# Patient Record
Sex: Male | Born: 1955 | Race: White | Hispanic: No | Marital: Single | Smoking: Former smoker
Health system: Southern US, Community
[De-identification: ages and names within clinical notes are randomized; demographics above are authoritative.]

## PROBLEM LIST (undated history)

## (undated) DIAGNOSIS — E119 Type 2 diabetes mellitus without complications: Secondary | ICD-10-CM

## (undated) DIAGNOSIS — E7800 Pure hypercholesterolemia, unspecified: Secondary | ICD-10-CM

## (undated) DIAGNOSIS — I10 Essential (primary) hypertension: Secondary | ICD-10-CM

## (undated) DIAGNOSIS — K219 Gastro-esophageal reflux disease without esophagitis: Secondary | ICD-10-CM

## (undated) DIAGNOSIS — F419 Anxiety disorder, unspecified: Secondary | ICD-10-CM

## (undated) HISTORY — DX: Pure hypercholesterolemia, unspecified: E78.00

## (undated) HISTORY — DX: Type 2 diabetes mellitus without complications: E11.9

## (undated) HISTORY — PX: COLONOSCOPY, ESOPHAGOGASTRODUODENOSCOPY (EGD) AND ESOPHAGEAL DILATION: SHX5781

## (undated) HISTORY — PX: COLONOSCOPY: SHX174

---

## 2015-06-11 ENCOUNTER — Encounter (INDEPENDENT_AMBULATORY_CARE_PROVIDER_SITE_OTHER): Payer: Self-pay | Admitting: *Deleted

## 2015-06-30 ENCOUNTER — Encounter (INDEPENDENT_AMBULATORY_CARE_PROVIDER_SITE_OTHER): Payer: Self-pay | Admitting: *Deleted

## 2015-07-01 ENCOUNTER — Other Ambulatory Visit (INDEPENDENT_AMBULATORY_CARE_PROVIDER_SITE_OTHER): Payer: Self-pay | Admitting: *Deleted

## 2015-07-01 DIAGNOSIS — Z1211 Encounter for screening for malignant neoplasm of colon: Secondary | ICD-10-CM

## 2015-07-01 DIAGNOSIS — Z800 Family history of malignant neoplasm of digestive organs: Secondary | ICD-10-CM

## 2015-08-28 ENCOUNTER — Telehealth (INDEPENDENT_AMBULATORY_CARE_PROVIDER_SITE_OTHER): Payer: Self-pay | Admitting: *Deleted

## 2015-08-28 NOTE — Telephone Encounter (Signed)
Patient needs trilyte 

## 2015-09-01 MED ORDER — PEG 3350-KCL-NA BICARB-NACL 420 G PO SOLR: 4000.0000 mL | Freq: Once | ORAL | 0 refills | Status: AC

## 2015-09-14 ENCOUNTER — Telehealth (INDEPENDENT_AMBULATORY_CARE_PROVIDER_SITE_OTHER): Payer: Self-pay | Admitting: *Deleted

## 2015-09-14 NOTE — Telephone Encounter (Signed)
Referring MD/PCP:    Procedure: tcs  Reason/Indication:  Screening, fam hx colon ca  Has patient had this procedure before?  Yes, 8-9 yrs ago  If so, when, by whom and where?    Is there a family history of colon cancer?  Yes, mother & sister  Who?  What age when diagnosed?    Is patient diabetic?   no      Does patient have prosthetic heart valve or mechanical valve?  no  Do you have a pacemaker?  no  Has patient ever had endocarditis? no  Has patient had joint replacement within last 12 months?  no  Does patient tend to be constipated or take laxatives? no  Does patient have a history of alcohol/drug use?  no  Is patient on Coumadin, Plavix and/or Aspirin? no  Medications: citalopram herb daily, esomeprazole 40 mg daily, losartan 25 mg daily, vit d 50,000 2 tab weekly  Allergies: nkda  Medication Adjustment:   Procedure date & time: 10/08/15 at 830

## 2015-09-16 NOTE — Telephone Encounter (Signed)
agree

## 2015-10-08 ENCOUNTER — Encounter (HOSPITAL_COMMUNITY): Payer: Self-pay | Admitting: *Deleted

## 2015-10-08 ENCOUNTER — Ambulatory Visit (HOSPITAL_COMMUNITY)
Admission: RE | Admit: 2015-10-08 | Discharge: 2015-10-08 | Disposition: A | Discharge status: Home / Self Care | Payer: BLUE CROSS/BLUE SHIELD | Source: Ambulatory Visit | Attending: Internal Medicine | Admitting: Internal Medicine

## 2015-10-08 ENCOUNTER — Encounter (HOSPITAL_COMMUNITY)
Admission: RE | Disposition: A | Discharge status: Home / Self Care | Payer: Self-pay | Source: Ambulatory Visit | Attending: Internal Medicine

## 2015-10-08 DIAGNOSIS — I10 Essential (primary) hypertension: Secondary | ICD-10-CM | POA: Diagnosis not present

## 2015-10-08 DIAGNOSIS — D125 Benign neoplasm of sigmoid colon: Secondary | ICD-10-CM | POA: Diagnosis not present

## 2015-10-08 DIAGNOSIS — K5730 Diverticulosis of large intestine without perforation or abscess without bleeding: Secondary | ICD-10-CM | POA: Diagnosis not present

## 2015-10-08 DIAGNOSIS — Z1211 Encounter for screening for malignant neoplasm of colon: Secondary | ICD-10-CM | POA: Diagnosis present

## 2015-10-08 DIAGNOSIS — K219 Gastro-esophageal reflux disease without esophagitis: Secondary | ICD-10-CM | POA: Insufficient documentation

## 2015-10-08 DIAGNOSIS — K648 Other hemorrhoids: Secondary | ICD-10-CM | POA: Diagnosis not present

## 2015-10-08 DIAGNOSIS — Z87891 Personal history of nicotine dependence: Secondary | ICD-10-CM | POA: Diagnosis not present

## 2015-10-08 DIAGNOSIS — Z800 Family history of malignant neoplasm of digestive organs: Secondary | ICD-10-CM | POA: Insufficient documentation

## 2015-10-08 DIAGNOSIS — Z79899 Other long term (current) drug therapy: Secondary | ICD-10-CM | POA: Diagnosis not present

## 2015-10-08 DIAGNOSIS — K635 Polyp of colon: Secondary | ICD-10-CM | POA: Diagnosis not present

## 2015-10-08 DIAGNOSIS — K644 Residual hemorrhoidal skin tags: Secondary | ICD-10-CM | POA: Insufficient documentation

## 2015-10-08 DIAGNOSIS — D127 Benign neoplasm of rectosigmoid junction: Secondary | ICD-10-CM | POA: Diagnosis not present

## 2015-10-08 HISTORY — DX: Essential (primary) hypertension: I10

## 2015-10-08 HISTORY — PX: COLONOSCOPY: SHX5424

## 2015-10-08 HISTORY — DX: Anxiety disorder, unspecified: F41.9

## 2015-10-08 HISTORY — DX: Gastro-esophageal reflux disease without esophagitis: K21.9

## 2015-10-08 SURGERY — COLONOSCOPY
Anesthesia: Moderate Sedation

## 2015-10-08 MED ORDER — MEPERIDINE HCL 50 MG/ML IJ SOLN
INTRAMUSCULAR | Status: AC
  Filled 2015-10-08: qty 1

## 2015-10-08 MED ORDER — MIDAZOLAM HCL 5 MG/5ML IJ SOLN
INTRAMUSCULAR | Status: AC
  Filled 2015-10-08: qty 10

## 2015-10-08 MED ORDER — DICYCLOMINE HCL 10 MG PO CAPS: 10.0000 mg | ORAL_CAPSULE | Freq: Two times a day (BID) | ORAL | 5 refills | Status: DC

## 2015-10-08 MED ORDER — SODIUM CHLORIDE 0.9 % IV SOLN
INTRAVENOUS | Status: DC
  Administered 2015-10-08: 08:00:00 via INTRAVENOUS

## 2015-10-08 MED ORDER — MEPERIDINE HCL 50 MG/ML IJ SOLN
INTRAMUSCULAR | Status: DC | PRN
  Administered 2015-10-08 (×2): 25 mg via INTRAVENOUS

## 2015-10-08 MED ORDER — MIDAZOLAM HCL 5 MG/5ML IJ SOLN
INTRAMUSCULAR | Status: DC | PRN
  Administered 2015-10-08: 2 mg via INTRAVENOUS
  Administered 2015-10-08: 1 mg via INTRAVENOUS
  Administered 2015-10-08 (×2): 2 mg via INTRAVENOUS

## 2015-10-08 MED ORDER — STERILE WATER FOR IRRIGATION IR SOLN
Status: DC | PRN
  Administered 2015-10-08: 09:00:00

## 2015-10-08 NOTE — Op Note (Signed)
[facility] Patient Name: Raymond Macias Procedure Date: 10/08/2015 8:40 AM MRN: [id number] Date of Birth: [birth year] Attending MD: Hildred Laser , MD CSN: [id number] Age: 60 Admit Type: Outpatient Procedure:                Colonoscopy Indications:              Screening patient at increased risk: Family history                            of colorectal cancer in multiple 1st-degree                            relatives Providers:                Hildred Laser, MD, Janeece Riggers, RN, Isabella Stalling,                            Technician Referring MD:             Alanda Amass, MD Medicines:                Meperidine 50 mg IV, Midazolam 6 mg IV Complications:            No immediate complications. Estimated Blood Loss:     Estimated blood loss was minimal. Procedure:                Pre-Anesthesia Assessment:                           - Prior to the procedure, a History and Physical                            was performed, and patient medications and                            allergies were reviewed. The patient's tolerance of                            previous anesthesia was also reviewed. The risks                            and benefits of the procedure and the sedation                            options and risks were discussed with the patient.                            All questions were answered, and informed consent                            was obtained. Prior Anticoagulants: The patient has                            taken no previous anticoagulant or antiplatelet  agents. ASA Grade Assessment: II - A patient with                            mild systemic disease. After reviewing the risks                            and benefits, the patient was deemed in                            satisfactory condition to undergo the procedure.                           After obtaining informed consent, the colonoscope                            was passed  under direct vision. Throughout the                            procedure, the patient's blood pressure, pulse, and                            oxygen saturations were monitored continuously. The                            [id number]) scope was introduced through                            the anus and advanced to the the cecum, identified                            by appendiceal orifice and ileocecal valve. The                            colonoscopy was performed without difficulty. The                            patient tolerated the procedure well. The quality                            of the bowel preparation was adequate. The                            ileocecal valve, appendiceal orifice, and rectum                            were photographed. Scope In: 8:49:30 AM Scope Out: 9:15:27 AM Scope Withdrawal Time: 0 hours 16 minutes 49 seconds  Total Procedure Duration: 0 hours 25 minutes 57 seconds  Findings:      Two sessile polyps were found in the recto-sigmoid colon and sigmoid       colon. The polyps were small in size. These polyps were removed with a       cold snare. Resection and retrieval were complete. The pathology       specimen was placed into Bottle  Number 1.      Scattered medium-mouthed diverticula were found in the sigmoid colon.      External and internal hemorrhoids were found during retroflexion. The       hemorrhoids were small. Impression:               - Two small polyps at the recto-sigmoid colon and                            in the sigmoid colon, removed with a cold snare.                            Resected and retrieved.                           - Diverticulosis in the sigmoid colon.                           - External and internal hemorrhoids. Moderate Sedation:      Moderate (conscious) sedation was administered by the endoscopy nurse       and supervised by the endoscopist. The following parameters were       monitored: oxygen saturation,  heart rate, blood pressure, CO2       capnography and response to care. Total physician intraservice time was       33 minutes. Recommendation:           - Patient has a contact number available for                            emergencies. The signs and symptoms of potential                            delayed complications were discussed with the                            patient. Return to normal activities tomorrow.                            Written discharge instructions were provided to the                            patient.                           - High fiber diet today.                           - Continue present medications.                           - Await pathology results.                           - Use Bentyl (dicyclomine) 10 mg PO BID 30 min AC.                           - Repeat colonoscopy  in 5 years. Procedure Code(s):        --- Professional ---                           [phone], Colonoscopy, flexible; with removal of                            tumor(s), polyp(s), or other lesion(s) by snare                            technique                           99152, Moderate sedation services provided by the                            same physician or other qualified health care                            professional performing the diagnostic or                            therapeutic service that the sedation supports,                            requiring the presence of an independent trained                            observer to assist in the monitoring of the                            patient's level of consciousness and physiological                            status; initial 15 minutes of intraservice time,                            patient age 5 years or older                           [phone], Moderate sedation services; each additional                            15 minutes intraservice time Diagnosis Code(s):        --- Professional ---                            Z80.0, Family history of malignant neoplasm of                            digestive organs                           D12.7, Benign neoplasm of rectosigmoid junction  D12.5, Benign neoplasm of sigmoid colon                           K64.8, Other hemorrhoids                           K57.30, Diverticulosis of large intestine without                            perforation or abscess without bleeding CPT copyright 2016 American Medical Association. All rights reserved. The codes documented in this report are preliminary and upon coder review may  be revised to meet current compliance requirements. Hildred Laser, MD Hildred Laser, MD 10/08/2015 9:24:12 AM This report has been signed electronically. Number of Addenda: 0

## 2015-10-08 NOTE — H&P (Addendum)
Raymond Macias is an 60 y.o. male.   Chief Complaint: Patient is here for colonoscopy. HPI: Patient is 60 year old Caucasian male who is in for screening colonoscopy. He denies abdominal pain change in bowel habits or rectal bleeding. Last colonoscopy was 11 years ago. Family history significant for CRC in sister who was 67 at the time of diagnosis and doing fine at 3. Mother was also  diagnosed with colon carcinoma in her late 54s.  Past Medical History:  Diagnosis Date  . Anxiety   . GERD (gastroesophageal reflux disease)   . Hypertension     Past Surgical History:  Procedure Laterality Date  . COLONOSCOPY    . COLONOSCOPY, ESOPHAGOGASTRODUODENOSCOPY (EGD) AND ESOPHAGEAL DILATION      Family History  Problem Relation Age of Onset  . Colon cancer Mother   . Stomach cancer Father   . Colon cancer Sister    Social History:  reports that he has quit smoking. His smoking use included Cigarettes. He has a 0.75 pack-year smoking history. He has never used smokeless tobacco. He reports that he drinks about 2.4 oz of alcohol per week . He reports that he does not use drugs.  Allergies: No Known Allergies  Medications Prior to Admission  Medication Sig Dispense Refill  . citalopram (CELEXA) 40 MG tablet Take 40 mg by mouth daily.    Marland Kitchen esomeprazole (NEXIUM) 40 MG capsule Take 40 mg by mouth daily at 12 noon.    Marland Kitchen losartan (COZAAR) 25 MG tablet Take 25 mg by mouth daily.    . Vitamin D, Ergocalciferol, (DRISDOL) 50000 units CAPS capsule TAKE ONE CAPSULE BY MOUTH TWICE A WEEK  4    No results found for this or any previous visit (from the past 87 hour(s)). No results found.  ROS  Blood pressure 125/79, pulse 76, temperature 98.4 F (36.9 C), temperature source Oral, resp. rate 17, height 5\' 8"  (1.727 m), weight 208 lb (94.3 kg), SpO2 97 %. Physical Exam  Constitutional: He appears well-developed and well-nourished.  HENT:  Mouth/Throat: Oropharynx is clear and moist.  Eyes:  Conjunctivae are normal. No scleral icterus.  Neck: No thyromegaly present.  Cardiovascular: Normal rate, regular rhythm and normal heart sounds.   Respiratory: Effort normal and breath sounds normal.  GI: Soft. He exhibits no distension and no mass. There is no tenderness.  Musculoskeletal: He exhibits no edema.  Lymphadenopathy:    He has no cervical adenopathy.  Neurological: He is alert.  Skin: Skin is warm and dry.     Assessment/Plan High risk screening colonoscopy. CRC in sister at age 83 and mother in her 13s.   Hildred Laser, MD 10/08/2015, 8:39 AM

## 2015-10-08 NOTE — Discharge Instructions (Signed)
Resume usual medications and high fiber diet. Dicyclomine 10 mg by mouth 30 minutes before breakfast and lunch daily on a can take 1 dose before breakfast for a few days and see if you need second dose. No driving for 24 hours. Physician will call with biopsy results.  Colonoscopy, Care After Refer to this sheet in the next few weeks. These instructions provide you with information on caring for yourself after your procedure. Your health care provider may also give you more specific instructions. Your treatment has been planned according to current medical practices, but problems sometimes occur. Call your health care provider if you have any problems or questions after your procedure. WHAT TO EXPECT AFTER THE PROCEDURE  After your procedure, it is typical to have the following:  A small amount of blood in your stool.  Moderate amounts of gas and mild abdominal cramping or bloating. HOME CARE INSTRUCTIONS  Do not drive, operate machinery, or sign important documents for 24 hours.  You may shower and resume your regular physical activities, but move at a slower pace for the first 24 hours.  Take frequent rest periods for the first 24 hours.  Walk around or put a warm pack on your abdomen to help reduce abdominal cramping and bloating.  Drink enough fluids to keep your urine clear or pale yellow.  You may resume your normal diet as instructed by your health care provider. Avoid heavy or fried foods that are hard to digest.  Avoid drinking alcohol for 24 hours or as instructed by your health care provider.  Only take over-the-counter or prescription medicines as directed by your health care provider.  If a tissue sample (biopsy) was taken during your procedure:  Do not take aspirin or blood thinners for 7 days, or as instructed by your health care provider.  Do not drink alcohol for 7 days, or as instructed by your health care provider.  Eat soft foods for the first 24 hours. SEEK  MEDICAL CARE IF: You have persistent spotting of blood in your stool 2-3 days after the procedure. SEEK IMMEDIATE MEDICAL CARE IF:  You have more than a small spotting of blood in your stool.  You pass large blood clots in your stool.  Your abdomen is swollen (distended).  You have nausea or vomiting.  You have a fever.  You have increasing abdominal pain that is not relieved with medicine.   This information is not intended to replace advice given to you by your health care provider. Make sure you discuss any questions you have with your health care provider.   Document Released: 09/01/2003 Document Revised: 11/07/2012 Document Reviewed: 09/24/2012 Elsevier Interactive Patient Education 2016 Elsevier Inc.  High-Fiber Diet Fiber, also called dietary fiber, is a type of carbohydrate found in fruits, vegetables, whole grains, and beans. A high-fiber diet can have many health benefits. Your health care provider may recommend a high-fiber diet to help:  Prevent constipation. Fiber can make your bowel movements more regular.  Lower your cholesterol.  Relieve hemorrhoids, uncomplicated diverticulosis, or irritable bowel syndrome.  Prevent overeating as part of a weight-loss plan.  Prevent heart disease, type 2 diabetes, and certain cancers. WHAT IS MY PLAN? The recommended daily intake of fiber includes:  38 grams for men under age 37.  72 grams for men over age 72.  64 grams for women under age 81.  45 grams for women over age 57. You can get the recommended daily intake of dietary fiber by eating a  variety of fruits, vegetables, grains, and beans. Your health care provider may also recommend a fiber supplement if it is not possible to get enough fiber through your diet. WHAT DO I NEED TO KNOW ABOUT A HIGH-FIBER DIET?  Fiber supplements have not been widely studied for their effectiveness, so it is better to get fiber through food sources.  Always check the fiber content  on thenutrition facts label of any prepackaged food. Look for foods that contain at least 5 grams of fiber per serving.  Ask your dietitian if you have questions about specific foods that are related to your condition, especially if those foods are not listed in the following section.  Increase your daily fiber consumption gradually. Increasing your intake of dietary fiber too quickly may cause bloating, cramping, or gas.  Drink plenty of water. Water helps you to digest fiber. WHAT FOODS CAN I EAT? Grains Whole-grain breads. Multigrain cereal. Oats and oatmeal. Brown rice. Barley. Bulgur wheat. Bruno. Bran muffins. Popcorn. Rye wafer crackers. Vegetables Sweet potatoes. Spinach. Kale. Artichokes. Cabbage. Broccoli. Green peas. Carrots. Squash. Fruits Berries. Pears. Apples. Oranges. Avocados. Prunes and raisins. Dried figs. Meats and Other Protein Sources Navy, kidney, pinto, and soy beans. Split peas. Lentils. Nuts and seeds. Dairy Fiber-fortified yogurt. Beverages Fiber-fortified soy milk. Fiber-fortified orange juice. Other Fiber bars. The items listed above may not be a complete list of recommended foods or beverages. Contact your dietitian for more options. WHAT FOODS ARE NOT RECOMMENDED? Grains White bread. Pasta made with refined flour. White rice. Vegetables Fried potatoes. Canned vegetables. Well-cooked vegetables.  Fruits Fruit juice. Cooked, strained fruit. Meats and Other Protein Sources Fatty cuts of meat. Fried Sales executive or fried fish. Dairy Milk. Yogurt. Cream cheese. Sour cream. Beverages Soft drinks. Other Cakes and pastries. Butter and oils. The items listed above may not be a complete list of foods and beverages to avoid. Contact your dietitian for more information. WHAT ARE SOME TIPS FOR INCLUDING HIGH-FIBER FOODS IN MY DIET?  Eat a wide variety of high-fiber foods.  Make sure that half of all grains consumed each day are whole grains.  Replace breads  and cereals made from refined flour or white flour with whole-grain breads and cereals.  Replace white rice with brown rice, bulgur wheat, or millet.  Start the day with a breakfast that is high in fiber, such as a cereal that contains at least 5 grams of fiber per serving.  Use beans in place of meat in soups, salads, or pasta.  Eat high-fiber snacks, such as berries, raw vegetables, nuts, or popcorn.   This information is not intended to replace advice given to you by your health care provider. Make sure you discuss any questions you have with your health care provider.   Document Released: 01/17/2005 Document Revised: 02/07/2014 Document Reviewed: 07/02/2013 Elsevier Interactive Patient Education [employer].

## 2015-10-12 ENCOUNTER — Encounter (HOSPITAL_COMMUNITY): Payer: Self-pay | Admitting: Internal Medicine

## 2017-04-28 ENCOUNTER — Other Ambulatory Visit (INDEPENDENT_AMBULATORY_CARE_PROVIDER_SITE_OTHER): Payer: Self-pay | Admitting: *Deleted

## 2017-04-28 MED ORDER — DICYCLOMINE HCL 10 MG PO CAPS: 10.0000 mg | ORAL_CAPSULE | Freq: Two times a day (BID) | ORAL | 5 refills | Status: AC

## 2017-05-30 ENCOUNTER — Ambulatory Visit (INDEPENDENT_AMBULATORY_CARE_PROVIDER_SITE_OTHER): Payer: BLUE CROSS/BLUE SHIELD | Admitting: Internal Medicine

## 2017-06-20 ENCOUNTER — Encounter (INDEPENDENT_AMBULATORY_CARE_PROVIDER_SITE_OTHER): Payer: Self-pay | Admitting: Internal Medicine

## 2017-06-20 ENCOUNTER — Ambulatory Visit (INDEPENDENT_AMBULATORY_CARE_PROVIDER_SITE_OTHER): Payer: BLUE CROSS/BLUE SHIELD | Admitting: Internal Medicine

## 2017-06-20 VITALS — BP 158/72 | HR 100 | Temp 98.1°F | Ht 68.0 in | Wt 186.1 lb

## 2017-06-20 DIAGNOSIS — K219 Gastro-esophageal reflux disease without esophagitis: Secondary | ICD-10-CM | POA: Diagnosis not present

## 2017-06-20 DIAGNOSIS — R109 Unspecified abdominal pain: Secondary | ICD-10-CM

## 2017-06-20 DIAGNOSIS — R197 Diarrhea, unspecified: Secondary | ICD-10-CM | POA: Diagnosis not present

## 2017-06-20 DIAGNOSIS — E119 Type 2 diabetes mellitus without complications: Secondary | ICD-10-CM | POA: Insufficient documentation

## 2017-06-20 MED ORDER — ESOMEPRAZOLE MAGNESIUM 40 MG PO CPDR: 40.0000 mg | DELAYED_RELEASE_CAPSULE | Freq: Every day | ORAL | 3 refills | Status: DC

## 2017-06-20 MED ORDER — METRONIDAZOLE 250 MG PO TABS: 250.0000 mg | ORAL_TABLET | Freq: Three times a day (TID) | ORAL | 0 refills | Status: AC

## 2017-06-20 NOTE — Patient Instructions (Addendum)
GI pathogen.  urinalysis

## 2017-06-20 NOTE — Addendum Note (Signed)
Addended by: Butch Penny on: 06/20/2017 02:04 PM   Modules accepted: Level of Service

## 2017-06-20 NOTE — Progress Notes (Addendum)
Subjective:    Patient ID: Raymond Macias, male    DOB: [birth year], 62 y.o.   MRN: [id number]  HPI  Presents today with c/o diarrhea.  Seen at [facility] for same.  No recent antibiotics. He presents today with c/o that he has not had a good BM in over a week. Last Friday he had pain in his back and his abdomen. He had diarrhea that was yellow and watery. Diarrhea lasted for over 24 hrs. Saturday night he had same symptoms. He was having 7-8 loose stools daily. Sunday night and Monday the diarrhea  reoccurred. He feels nauseated. He feels full. His appetite is good.  No fever. He has been taking Imodium at night for the diarrhea.  Has had diarrhea x 4 days.  He also c/o left flank pain at night during his BMs.  He had a normal urine 05/31/2017. No urinary symptoms.  He usually has a bout 2 BMs a day. Normal for him the eat and have a BM.  Has no energy. He doesn't want to get dehydrated. He is keeping fluids down.  Also c/o some GERD. Has been on Nexium in the past.  05/31/2017 H and H 15.7 and 46.5, ALP 55, AST 31, ALT 28   His last colonoscopy was in 2017: family hx of colon cancer in multiple 1st degree relatives.   Impression:               - Two small polyps at the recto-sigmoid colon and                            in the sigmoid colon, removed with a cold snare.                            Resected and retrieved.                           - Diverticulosis in the sigmoid colon.                           - External and internal   Biopsy: Two small polyps removed. One polyp was a tubular adenoma. Next colonoscopy in 5 yrs.   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   Review of Systems Past Medical History:  Diagnosis Date  . Anxiety   . Diabetes ([place])   . GERD (gastroesophageal reflux disease)   . High cholesterol   . Hypertension     Past Surgical History:   Procedure Laterality Date  . COLONOSCOPY    . COLONOSCOPY N/A 10/08/2015   Procedure: COLONOSCOPY;  Surgeon: Rogene Houston, MD;  Location: [facility];  Service: Endoscopy;  Laterality: N/A;  830  . COLONOSCOPY, ESOPHAGOGASTRODUODENOSCOPY (EGD) AND ESOPHAGEAL DILATION      No Known Allergies  Current Outpatient Medications on File Prior to Visit  Medication Sig Dispense Refill  . atorvastatin (LIPITOR) 20 MG tablet Take by mouth daily.    . ciprofloxacin-dexamethasone (CIPRODEX) OTIC suspension 4 drops 2 (two) times daily.    . citalopram (CELEXA) 40 MG tablet Take 40 mg by mouth daily.    Marland Kitchen dicyclomine (BENTYL) 10 MG capsule Take 1 capsule (10 mg total) by mouth 2 (two) times  daily before a meal. 60 capsule 5  . loratadine (CLARITIN) 10 MG tablet Take 10 mg by mouth daily.    Marland Kitchen losartan (COZAAR) 25 MG tablet Take 25 mg by mouth daily.    . metFORMIN (GLUCOPHAGE) 500 MG tablet Take by mouth 2 (two) times daily with a meal.    . Vitamin D, Ergocalciferol, (DRISDOL) 50000 units CAPS capsule TAKE ONE CAPSULE BY MOUTH TWICE A WEEK  4   No current facility-administered medications on file prior to visit.         Objective:   Physical Exam Blood pressure (!) 158/72, pulse 100, temperature 98.1 F (36.7 C), height 5\' 8"  (1.727 m), weight 186 lb 1.6 oz (84.4 kg). Alert and oriented. Skin warm and dry. Oral mucosa is moist.   . Sclera anicteric, conjunctivae is pink. Thyroid not enlarged. No cervical lymphadenopathy. Lungs clear. Heart regular rate and rhythm.  Abdomen is soft. Bowel sounds are positive. No hepatomegaly. No abdominal masses felt. No tenderness.  No edema to lower extremities.           Assessment & Plan:  Diarrhea, ? Infectious. Am going to get GI pathogen.  I am going tl give an Rx for Flagyl that he can start after he gets the GI pathogen. Left flank pain: Will get a UA.  GERD. Rx for Nexium sent to his pharmacy.

## 2017-06-21 ENCOUNTER — Telehealth (INDEPENDENT_AMBULATORY_CARE_PROVIDER_SITE_OTHER): Payer: Self-pay | Admitting: Internal Medicine

## 2017-06-21 MED ORDER — ESOMEPRAZOLE MAGNESIUM 40 MG PO PACK: 40.0000 mg | PACK | Freq: Every day | ORAL | 12 refills | Status: DC

## 2017-06-21 NOTE — Telephone Encounter (Signed)
Corrected Rx sent to his pharmacy

## 2017-06-22 ENCOUNTER — Telehealth (INDEPENDENT_AMBULATORY_CARE_PROVIDER_SITE_OTHER): Payer: Self-pay | Admitting: *Deleted

## 2017-06-22 LAB — GASTROINTESTINAL PATHOGEN PANEL PCR
C. DIFFICILE TOX A/B, PCR: NOT DETECTED
CRYPTOSPORIDIUM, PCR: NOT DETECTED
Campylobacter, PCR: NOT DETECTED
E COLI (ETEC) LT/ST, PCR: NOT DETECTED
E COLI (STEC) STX1/STX2, PCR: NOT DETECTED
E coli 0157, PCR: NOT DETECTED
Giardia lamblia, PCR: NOT DETECTED
Norovirus, PCR: NOT DETECTED
Rotavirus A, PCR: NOT DETECTED
SALMONELLA, PCR: NOT DETECTED
Shigella, PCR: NOT DETECTED

## 2017-06-22 LAB — URINALYSIS
Bilirubin Urine: NEGATIVE
Glucose, UA: NEGATIVE
HGB URINE DIPSTICK: NEGATIVE
LEUKOCYTES UA: NEGATIVE
Nitrite: NEGATIVE
Specific Gravity, Urine: 1.035 (ref 1.001–1.03)
pH: 6 (ref 5.0–8.0)

## 2017-06-22 NOTE — Telephone Encounter (Signed)
Patient was looking at AVS and he said his Lipitor should be 40 mg instead of 20 mg

## 2017-06-27 NOTE — Telephone Encounter (Signed)
This has been corrected.

## 2017-07-26 ENCOUNTER — Ambulatory Visit (INDEPENDENT_AMBULATORY_CARE_PROVIDER_SITE_OTHER): Payer: Self-pay | Admitting: Orthopaedic Surgery

## 2017-08-01 ENCOUNTER — Ambulatory Visit (INDEPENDENT_AMBULATORY_CARE_PROVIDER_SITE_OTHER): Payer: BLUE CROSS/BLUE SHIELD | Admitting: Orthopaedic Surgery

## 2017-08-01 ENCOUNTER — Encounter (INDEPENDENT_AMBULATORY_CARE_PROVIDER_SITE_OTHER): Payer: Self-pay | Admitting: Orthopaedic Surgery

## 2017-08-01 DIAGNOSIS — M720 Palmar fascial fibromatosis [Dupuytren]: Secondary | ICD-10-CM | POA: Insufficient documentation

## 2017-08-01 NOTE — Progress Notes (Signed)
Office Visit Note   Patient: Raymond Macias           Date of Birth: 07/15/1955           MRN: [id number] Visit Date: 08/01/2017              Requested by: Roderic Scarce, MD [address] [place] [address] PCP: Roderic Scarce, MD   Assessment & Plan: Visit Diagnoses:  1. Dupuytren contracture     Plan: Impression is right hand Dupuytren's contracture.  He will take over-the-counter medications as needed for pain.  He will follow-up with [place] on an as-needed basis.  Follow-Up Instructions: Return if symptoms worsen or fail to improve.   Orders:  No orders of the defined types were placed in this encounter.  No orders of the defined types were placed in this encounter.     Procedures: No procedures performed   Clinical Data: No additional findings.   Subjective: Chief Complaint  Patient presents with  . Right Hand - Follow-up    HPI patient is a pleasant 62 year old gentleman who presents to our clinic today with pain to the right hand fifth finger.  This began approximately 1 to 2 months ago without any known injury or change in activity.  The pain he has is along the tendon radiating from the fifth MCP joint to the wrist on the volar side.  Pain is worse in the morning as well as when he puts pressure to the area.  He has not taken any medication for this.  No numbness, tingling or burning.  Review of Systems as detailed in HPI.  All others reviewed and are negative.   Objective: Vital Signs: There were no vitals taken for this visit.  Physical Exam well-developed and well-nourished gentleman in no acute distress.  Alert and oriented x3.  Ortho Exam examination of his right hand long finger reveals a palpable and tender cord along the volar aspect of the fifth MP joint radiating to the wrist as well as to lesser extent the fourth.  Full flexion and extension of all 5 fingers.  He is neurovascularly intact distally.  Specialty Comments:  No  specialty comments available.  Imaging: No new imaging today   PMFS History: Patient Active Problem List   Diagnosis Date Noted  . Dupuytren contracture 08/01/2017  . Diabetes [facility])    Past Medical History:  Diagnosis Date  . Anxiety   . Diabetes ([place])   . GERD (gastroesophageal reflux disease)   . High cholesterol   . Hypertension     Family History  Problem Relation Age of Onset  . Colon cancer Mother   . Stomach cancer Father   . Colon cancer Sister     Past Surgical History:  Procedure Laterality Date  . COLONOSCOPY    . COLONOSCOPY N/A 10/08/2015   Procedure: COLONOSCOPY;  Surgeon: Rogene Houston, MD;  [facility];  Service: Endoscopy;  Laterality: N/A;  830  . COLONOSCOPY, ESOPHAGOGASTRODUODENOSCOPY (EGD) AND ESOPHAGEAL DILATION     Social History   Occupational History  . Not on file  Tobacco Use  . Smoking status: Former Smoker    Packs/day: 0.25    Years: 3.00    Pack years: 0.75    Types: Cigarettes  . Smokeless tobacco: Never Used  Substance and Sexual Activity  . Alcohol use: Yes    Alcohol/week: 2.4 oz    Types: 3 Glasses of wine, 1 Shots  of liquor per week  . Drug use: No  . Sexual activity: Not on file

## 2018-07-16 ENCOUNTER — Other Ambulatory Visit (INDEPENDENT_AMBULATORY_CARE_PROVIDER_SITE_OTHER): Payer: Self-pay | Admitting: Internal Medicine

## 2018-11-05 ENCOUNTER — Other Ambulatory Visit: Payer: Self-pay

## 2018-11-05 ENCOUNTER — Encounter (HOSPITAL_COMMUNITY): Payer: Self-pay

## 2018-11-05 ENCOUNTER — Emergency Department (HOSPITAL_COMMUNITY)
Admission: EM | Admit: 2018-11-05 | Discharge: 2018-11-05 | Disposition: A | Discharge status: Home / Self Care | Payer: Medicaid - Out of State | Attending: Emergency Medicine | Admitting: Emergency Medicine

## 2018-11-05 ENCOUNTER — Emergency Department (HOSPITAL_COMMUNITY): Payer: Medicaid - Out of State

## 2018-11-05 DIAGNOSIS — E119 Type 2 diabetes mellitus without complications: Secondary | ICD-10-CM | POA: Diagnosis not present

## 2018-11-05 DIAGNOSIS — Z87891 Personal history of nicotine dependence: Secondary | ICD-10-CM | POA: Diagnosis not present

## 2018-11-05 DIAGNOSIS — Z7984 Long term (current) use of oral hypoglycemic drugs: Secondary | ICD-10-CM | POA: Insufficient documentation

## 2018-11-05 DIAGNOSIS — R1032 Left lower quadrant pain: Secondary | ICD-10-CM | POA: Insufficient documentation

## 2018-11-05 DIAGNOSIS — Z79899 Other long term (current) drug therapy: Secondary | ICD-10-CM | POA: Insufficient documentation

## 2018-11-05 DIAGNOSIS — I10 Essential (primary) hypertension: Secondary | ICD-10-CM | POA: Insufficient documentation

## 2018-11-05 DIAGNOSIS — K529 Noninfective gastroenteritis and colitis, unspecified: Secondary | ICD-10-CM | POA: Diagnosis not present

## 2018-11-05 LAB — CBC
HCT: 43.1 % (ref 39.0–52.0)
Hemoglobin: 14.5 g/dL (ref 13.0–17.0)
MCH: 31.3 pg (ref 26.0–34.0)
MCHC: 33.6 g/dL (ref 30.0–36.0)
MCV: 93.1 fL (ref 80.0–100.0)
Platelets: 250 10*3/uL (ref 150–400)
RBC: 4.63 MIL/uL (ref 4.22–5.81)
RDW: 12.3 % (ref 11.5–15.5)
WBC: 8.4 10*3/uL (ref 4.0–10.5)
nRBC: 0 % (ref 0.0–0.2)

## 2018-11-05 LAB — COMPREHENSIVE METABOLIC PANEL
ALT: 33 U/L (ref 0–44)
AST: 48 U/L — ABNORMAL HIGH (ref 15–41)
Albumin: 4.5 g/dL (ref 3.5–5.0)
Alkaline Phosphatase: 49 U/L (ref 38–126)
Anion gap: 11 (ref 5–15)
BUN: 9 mg/dL (ref 8–23)
CO2: 26 mmol/L (ref 22–32)
Calcium: 9 mg/dL (ref 8.9–10.3)
Chloride: 99 mmol/L (ref 98–111)
Creatinine, Ser: 0.89 mg/dL (ref 0.61–1.24)
GFR calc Af Amer: >60 mL/min (ref >60)
GFR calc non Af Amer: >60 mL/min (ref >60)
Glucose, Bld: 120 mg/dL — ABNORMAL HIGH (ref 70–99)
Potassium: 4 mmol/L (ref 3.5–5.1)
Sodium: 136 mmol/L (ref 135–145)
Total Bilirubin: 1 mg/dL (ref 0.3–1.2)
Total Protein: 7.7 g/dL (ref 6.5–8.1)

## 2018-11-05 LAB — URINALYSIS, ROUTINE W REFLEX MICROSCOPIC
Bilirubin Urine: NEGATIVE
Glucose, UA: NEGATIVE mg/dL
Hgb urine dipstick: NEGATIVE
Ketones, ur: 5 mg/dL — AB
Leukocytes,Ua: NEGATIVE
Nitrite: NEGATIVE
Protein, ur: NEGATIVE mg/dL
Specific Gravity, Urine: 1.018 (ref 1.005–1.030)
pH: 5 (ref 5.0–8.0)

## 2018-11-05 LAB — LIPASE, BLOOD: Lipase: 25 U/L (ref 11–51)

## 2018-11-05 MED ORDER — SODIUM CHLORIDE 0.9% FLUSH: 3.0000 mL | Freq: Once | INTRAVENOUS | Status: DC

## 2018-11-05 MED ORDER — PROMETHAZINE HCL 25 MG PO TABS: 25.0000 mg | ORAL_TABLET | Freq: Four times a day (QID) | ORAL | 0 refills | Status: AC | PRN

## 2018-11-05 MED ORDER — METRONIDAZOLE 500 MG PO TABS: 500.0000 mg | ORAL_TABLET | Freq: Two times a day (BID) | ORAL | 0 refills | Status: AC

## 2018-11-05 MED ORDER — ONDANSETRON HCL 4 MG/2ML IJ SOLN: 4.0000 mg | Freq: Once | INTRAMUSCULAR | Status: DC

## 2018-11-05 MED ORDER — CIPROFLOXACIN HCL 500 MG PO TABS: 500.0000 mg | ORAL_TABLET | Freq: Two times a day (BID) | ORAL | 0 refills | Status: AC

## 2018-11-05 MED ORDER — SODIUM CHLORIDE 0.9 % IV BOLUS
1000.0000 mL | Freq: Once | INTRAVENOUS | Status: AC
  Administered 2018-11-05: 15:00:00 1000 mL via INTRAVENOUS

## 2018-11-05 MED ORDER — IOHEXOL 300 MG/ML  SOLN
100.0000 mL | Freq: Once | INTRAMUSCULAR | Status: AC | PRN
  Administered 2018-11-05: 16:00:00 100 mL via INTRAVENOUS

## 2018-11-05 NOTE — Discharge Instructions (Addendum)
Your CT scan did not show any significant abnormality at this time.  There is some fluid within the bowels which can show some irritation and infection.  You need to follow-up with your GI specialist.  Return here as needed.

## 2018-11-05 NOTE — ED Notes (Signed)
Pt denies nausea at this time.

## 2018-11-05 NOTE — ED Notes (Signed)
Per phlebotomy, labs have already been drawn

## 2018-11-05 NOTE — ED Triage Notes (Signed)
Pt presents to ED with complaints of lower abdominal pain, nausea and vomiting x 1 since last night.

## 2018-11-05 NOTE — ED Provider Notes (Signed)
[facility] EMERGENCY DEPARTMENT Provider Note   CSN: [id number] Arrival date & time: 11/05/18  1058     History   Chief Complaint Chief Complaint  Patient presents with  . Abdominal Pain    HPI Raymond Macias is a 63 y.o. male.     HPI Patient presents to the emergency department with abdominal pain with nausea vomiting.  The patient states that he has had a history of diverticulitis.  The patient states that he noticed the pain started last night.  The patient states that he thought it may be related to some food that he had last night as well.  Patient states that he did not take any medications prior to arrival for his symptoms.  Patient states he does see a GI doctor. Past Medical History:  Diagnosis Date  . Anxiety   . Diabetes ([place])   . GERD (gastroesophageal reflux disease)   . High cholesterol   . Hypertension     Patient Active Problem List   Diagnosis Date Noted  . Dupuytren contracture 08/01/2017  . Diabetes [facility])     Past Surgical History:  Procedure Laterality Date  . COLONOSCOPY    . COLONOSCOPY N/A 10/08/2015   Procedure: COLONOSCOPY;  Surgeon: Rogene Houston, MD;  Location[facility];  Service: Endoscopy;  Laterality: N/A;  830  . COLONOSCOPY, ESOPHAGOGASTRODUODENOSCOPY (EGD) AND ESOPHAGEAL DILATION          Home Medications    Prior to Admission medications   Medication Sig Start Date End Date Taking? Authorizing Provider  atorvastatin (LIPITOR) 40 MG tablet Take 40 mg by mouth daily.     [provider]  busPIRone (BUSPAR) 10 MG tablet Take 10 mg by mouth 2 (two) times daily. 10/25/18   [provider]  ciprofloxacin-dexamethasone (CIPRODEX) OTIC suspension 4 drops 2 (two) times daily.    [provider]  citalopram (CELEXA) 40 MG tablet Take 40 mg by mouth daily.    [provider]  dicyclomine (BENTYL) 10 MG capsule Take 1 capsule (10 mg total) by mouth 2 (two) times daily before a meal. 04/28/17    Setzer, Terri L, NP  escitalopram (LEXAPRO) 20 MG tablet Take 20 mg by mouth daily. 09/25/18   [provider]  esomeprazole (NEXIUM) 40 MG capsule TAKE 1 CAPSULE BY MOUTH ONCE DAILY BEFORE  BREAKFAST 07/17/18   Setzer, Rona Ravens, NP  loratadine (CLARITIN) 10 MG tablet Take 10 mg by mouth daily.    [provider]  losartan (COZAAR) 25 MG tablet Take 25 mg by mouth daily.    [provider]  metFORMIN (GLUCOPHAGE) 500 MG tablet Take by mouth 2 (two) times daily with a meal.    [provider]  metroNIDAZOLE (FLAGYL) 250 MG tablet Take 1 tablet (250 mg total) by mouth 3 (three) times daily. 06/20/17   Setzer, Rona Ravens, NP  Vitamin D, Ergocalciferol, (DRISDOL) 50000 units CAPS capsule TAKE ONE CAPSULE BY MOUTH TWICE A WEEK 09/01/15   [provider]    Family History Family History  Problem Relation Age of Onset  . Colon cancer Mother   . Stomach cancer Father   . Colon cancer Sister     Social History Social History   Tobacco Use  . Smoking status: Former Smoker    Packs/day: 0.25    Years: 3.00    Pack years: 0.75    Types: Cigarettes  . Smokeless tobacco: Never Used  Substance Use Topics  .  Alcohol use: Yes    Alcohol/week: 4.0 standard drinks    Types: 3 Glasses of wine, 1 Shots of liquor per week  . Drug use: No     Allergies   Patient has no known allergies.   Review of Systems Review of Systems  All other systems negative except as documented in the HPI. All pertinent positives and negatives as reviewed in the HPI. Physical Exam Updated Vital Signs BP 123/65   Pulse (!) 59   Temp 97.9 F (36.6 C) (Oral)   Resp 18   Ht 5\' 8"  (1.727 m)   Wt 82.1 kg   SpO2 97%   BMI 27.52 kg/m   Physical Exam Vitals signs and nursing note reviewed.  Constitutional:      General: He is not in acute distress.    Appearance: He is well-developed.  HENT:     Head: Normocephalic and atraumatic.  Eyes:     Pupils: Pupils are equal,  round, and reactive to light.  Neck:     Musculoskeletal: Normal range of motion and neck supple.  Cardiovascular:     Rate and Rhythm: Normal rate and regular rhythm.     Heart sounds: Normal heart sounds. No murmur. No friction rub. No gallop.   Pulmonary:     Effort: Pulmonary effort is normal. No respiratory distress.     Breath sounds: Normal breath sounds. No wheezing.  Abdominal:     General: Bowel sounds are normal. There is no distension.     Palpations: Abdomen is soft.     Tenderness: There is abdominal tenderness in the left lower quadrant. There is no guarding or rebound. Negative signs include Rovsing's sign.  Skin:    General: Skin is warm and dry.     Capillary Refill: Capillary refill takes less than 2 seconds.     Findings: No erythema or rash.  Neurological:     Mental Status: He is alert and oriented to person, place, and time.     Motor: No abnormal muscle tone.     Coordination: Coordination normal.  Psychiatric:        Behavior: Behavior normal.      ED Treatments / Results  Labs (all labs ordered are listed, but only abnormal results are displayed) Labs Reviewed  COMPREHENSIVE METABOLIC PANEL - Abnormal; Notable for the following components:      Result Value   Glucose, Bld 120 (*)    AST 48 (*)    All other components within normal limits  URINALYSIS, ROUTINE W REFLEX MICROSCOPIC - Abnormal; Notable for the following components:   Ketones, ur 5 (*)    All other components within normal limits  LIPASE, BLOOD  CBC    EKG None  Radiology Ct Abdomen Pelvis W Contrast  Result Date: 11/05/2018 CLINICAL DATA:  Acute pain, lower abdominal pain and lower back pain with constipation, nausea and vomiting since last night. EXAM: CT ABDOMEN AND PELVIS WITH CONTRAST TECHNIQUE: Multidetector CT imaging of the abdomen and pelvis was performed using the standard protocol following bolus administration of intravenous contrast. CONTRAST:  165mL OMNIPAQUE IOHEXOL  300 MG/ML  SOLN COMPARISON:  None. FINDINGS: Lower chest: No acute abnormality. Hepatobiliary: No focal liver abnormality is seen. No gallstones, gallbladder wall thickening, or biliary dilatation. Pancreas: Unremarkable. No pancreatic ductal dilatation or surrounding inflammatory changes. Spleen: Normal in size without focal abnormality. Adrenals/Urinary Tract: Adrenal glands appear normal. Kidneys are unremarkable without mass, stone or hydronephrosis. No ureteral or bladder calculi identified.  Bladder is unremarkable. Stomach/Bowel: No dilated large or small bowel loops. Extensive diverticulosis of the descending and sigmoid colon no pericolonic inflammation or other confirming signs of an acute diverticulitis at this time. Appendix is normal. Fluid is present throughout the majority of the nondistended small bowel, with scattered air-fluid levels, which can be a sign of an underlying small bowel enteritis. Vascular/Lymphatic: Aortic atherosclerosis. No acute appearing vascular abnormality. No enlarged lymph nodes seen in the abdomen or pelvis. Reproductive: Prostate gland is mildly prominent with central dystrophic calcifications. Other: No free fluid or abscess collection. No free intraperitoneal air. Musculoskeletal: No acute or suspicious osseous finding. Degenerative spondylosis of the slightly scoliotic lumbar spine, mild to moderate in degree. Small bilateral inguinal hernias which contain fat only. IMPRESSION: 1. Fluid is present throughout the majority of the nondistended small bowel, with scattered air-fluid levels, which can be a secondary sign of an underlying small bowel enteritis of infectious or inflammatory nature. However, there is no bowel wall thickening, mesenteric inflammation or other confirming signs of a small bowel enteritis. 2. Extensive diverticulosis of the descending and sigmoid colon without evidence of acute diverticulitis at this time. 3. Aortic atherosclerosis. 4. No renal or  ureteral calculi. 5. Additional chronic/incidental findings detailed above. Aortic Atherosclerosis (ICD10-I70.0). Electronically Signed   By: Franki Cabot M.D.   On: 11/05/2018 16:13    Procedures Procedures (including critical care time)  Medications Ordered in ED Medications  ondansetron (ZOFRAN) injection 4 mg (0 mg Intravenous Hold 11/05/18 1438)  sodium chloride 0.9 % bolus 1,000 mL (0 mLs Intravenous Stopped 11/05/18 1532)  iohexol (OMNIPAQUE) 300 MG/ML solution 100 mL (100 mLs Intravenous Contrast Given 11/05/18 1544)     Initial Impression / Assessment and Plan / ED Course  I have reviewed the triage vital signs and the nursing notes.  Pertinent labs & imaging results that were available during my care of the patient were reviewed by me and considered in my medical decision making (see chart for details).       Patient will be treated for the enteritis.  Have advised him of the results and all questions were answered.  I advised him he will need to follow-up with his GI specialist.  Told to return here as needed.  Patient agrees this plan and all questions were answered.  Final Clinical Impressions(s) / ED Diagnoses   Final diagnoses:  None    ED Discharge Orders    None       Dalia Heading, PA-C 11/05/18 1621    Milton Ferguson, MD 11/07/18 1137

## 2018-11-05 NOTE — ED Notes (Signed)
See triage notes. Pt states pain better now. N/v early this am x 1. Pt c/o constipation.

## 2019-04-07 ENCOUNTER — Ambulatory Visit: Payer: Medicaid - Out of State | Attending: Internal Medicine

## 2019-04-07 DIAGNOSIS — Z23 Encounter for immunization: Secondary | ICD-10-CM | POA: Insufficient documentation

## 2019-04-07 NOTE — Progress Notes (Signed)
   Covid-19 Vaccination Clinic  Name:  Raymond Macias    MRN: [id number] DOB: 11/21/1955  04/07/2019  Mr. Raymond Macias was observed post Covid-19 immunization for 30 minutes based on pre-vaccination screening without incident. He was provided with Vaccine Information Sheet and instruction to access the V-Safe system.   Mr. Raymond Macias was instructed to call 911 with any severe reactions post vaccine: Marland Kitchen Difficulty breathing  . Swelling of face and throat  . A fast heartbeat  . A bad rash all over body  . Dizziness and weakness   Immunizations Administered    Name Date Dose VIS Date Route   Pfizer COVID-19 Vaccine 04/07/2019 11:43 AM 0.3 mL 01/11/2019 Intramuscular   Manufacturer: [place]   Lot: [id number]   [place]: [id number]

## 2019-04-28 ENCOUNTER — Other Ambulatory Visit: Payer: Self-pay

## 2019-04-28 ENCOUNTER — Ambulatory Visit: Payer: Medicaid - Out of State | Attending: Internal Medicine

## 2019-04-28 DIAGNOSIS — Z23 Encounter for immunization: Secondary | ICD-10-CM

## 2019-04-28 NOTE — Progress Notes (Signed)
   Covid-19 Vaccination Clinic  Name:  Raymond Macias    MRN: [id number] DOB: Mar 15, 1955  04/28/2019  [name] was observed post Covid-19 immunization for 30 minutes based on pre-vaccination screening without incident. He was provided with Vaccine Information Sheet and instruction to access the V-Safe system.   [name] was instructed to call 911 with any severe reactions post vaccine: Marland Kitchen Difficulty breathing  . Swelling of face and throat  . A fast heartbeat  . A bad rash all over body  . Dizziness and weakness   Immunizations Administered    Name Date Dose VIS Date Route   Pfizer COVID-19 Vaccine 04/28/2019  3:45 PM 0.3 mL 01/11/2019 Intramuscular   Manufacturer: [place]   Lot: [id number]   [place]: [id number]

## 2019-07-22 ENCOUNTER — Ambulatory Visit
Admission: RE | Admit: 2019-07-22 | Discharge: 2019-07-22 | Disposition: A | Discharge status: Home / Self Care | Payer: BC Managed Care – PPO | Source: Ambulatory Visit | Attending: Emergency Medicine | Admitting: Emergency Medicine

## 2019-07-22 ENCOUNTER — Other Ambulatory Visit: Payer: Self-pay

## 2019-07-22 VITALS — BP 128/75 | HR 72 | Temp 98.1°F | Resp 18 | Ht 68.0 in | Wt 198.0 lb

## 2019-07-22 DIAGNOSIS — S81802A Unspecified open wound, left lower leg, initial encounter: Secondary | ICD-10-CM | POA: Diagnosis not present

## 2019-07-22 DIAGNOSIS — L089 Local infection of the skin and subcutaneous tissue, unspecified: Secondary | ICD-10-CM

## 2019-07-22 MED ORDER — DOXYCYCLINE HYCLATE 100 MG PO CAPS: 100.0000 mg | ORAL_CAPSULE | Freq: Two times a day (BID) | ORAL | 0 refills | Status: AC

## 2019-07-22 NOTE — Discharge Instructions (Signed)
Wash site daily with warm water and mild soap Keep covered to avoid friction Take antibiotic as prescribed and to completion Follow up here or with PCP if symptoms persists Return or go to the ED if you have any new or worsening symptoms increased redness, swelling, pain, nausea, vomiting, fever, chills, etc...  

## 2019-07-22 NOTE — ED Triage Notes (Signed)
LT ankle got cut on a piece of glass at the beginning of the month, area around scab is red and sore at this time.

## 2019-07-22 NOTE — ED Provider Notes (Signed)
[place]   [id number] 07/22/19 Arrival Time: 25   CC: Wound infection  SUBJECTIVE:  Raymond Macias is a 64 y.o. male who presents with a possible wound infection to LLE x 17 days ago. Tripped over sharp piece of glass and cut his LLE.  Denies glass shattering or concern for glass shards in wound.  Has tried cleaning with peroxide, however, reports associated redness and swelling.  Denies previous symptoms in the past.  Denies fever, chills, nausea, vomiting, drainage.    ROS: As per HPI.  All other pertinent ROS negative.     Past Medical History:  Diagnosis Date  . Anxiety   . Diabetes ([place])   . GERD (gastroesophageal reflux disease)   . High cholesterol   . Hypertension    Past Surgical History:  Procedure Laterality Date  . COLONOSCOPY    . COLONOSCOPY N/A 10/08/2015   Procedure: COLONOSCOPY;  Surgeon: Rogene Houston, MD;  Location[facility];  Service: Endoscopy;  Laterality: N/A;  830  . COLONOSCOPY, ESOPHAGOGASTRODUODENOSCOPY (EGD) AND ESOPHAGEAL DILATION     No Known Allergies No current facility-administered medications on file prior to encounter.   Current Outpatient Medications on File Prior to Encounter  Medication Sig Dispense Refill  . atorvastatin (LIPITOR) 40 MG tablet Take 40 mg by mouth daily.     . busPIRone (BUSPAR) 10 MG tablet Take 10 mg by mouth 2 (two) times daily.    . ciprofloxacin (CIPRO) 500 MG tablet Take 1 tablet (500 mg total) by mouth 2 (two) times daily. 20 tablet 0  . ciprofloxacin-dexamethasone (CIPRODEX) OTIC suspension 4 drops 2 (two) times daily.    . citalopram (CELEXA) 40 MG tablet Take 40 mg by mouth daily.    Marland Kitchen dicyclomine (BENTYL) 10 MG capsule Take 1 capsule (10 mg total) by mouth 2 (two) times daily before a meal. 60 capsule 5  . escitalopram (LEXAPRO) 20 MG tablet Take 20 mg by mouth daily.    Marland Kitchen esomeprazole (NEXIUM) 40 MG capsule TAKE 1 CAPSULE BY MOUTH ONCE DAILY BEFORE  BREAKFAST 30 capsule 4  . loratadine  (CLARITIN) 10 MG tablet Take 10 mg by mouth daily.    Marland Kitchen losartan (COZAAR) 25 MG tablet Take 25 mg by mouth daily.    . metFORMIN (GLUCOPHAGE) 500 MG tablet Take by mouth 2 (two) times daily with a meal.    . metroNIDAZOLE (FLAGYL) 250 MG tablet Take 1 tablet (250 mg total) by mouth 3 (three) times daily. 30 tablet 0  . metroNIDAZOLE (FLAGYL) 500 MG tablet Take 1 tablet (500 mg total) by mouth 2 (two) times daily. 20 tablet 0  . promethazine (PHENERGAN) 25 MG tablet Take 1 tablet (25 mg total) by mouth every 6 (six) hours as needed for nausea or vomiting. 10 tablet 0  . Vitamin D, Ergocalciferol, (DRISDOL) 50000 units CAPS capsule TAKE ONE CAPSULE BY MOUTH TWICE A WEEK  4   Social History   Socioeconomic History  . Marital status: Single    Spouse name: Not on file  . Number of children: Not on file  . Years of education: Not on file  . Highest education level: Not on file  Occupational History  . Not on file  Tobacco Use  . Smoking status: Former Smoker    Packs/day: 0.25    Years: 3.00    Pack years: 0.75    Types: Cigarettes  . Smokeless tobacco: Never Used  Substance and Sexual Activity  . Alcohol use:  Yes    Alcohol/week: 4.0 standard drinks    Types: 3 Glasses of wine, 1 Shots of liquor per week  . Drug use: No  . Sexual activity: Not on file  Other Topics Concern  . Not on file  Social History Narrative  . Not on file   Social Determinants of Health   Financial Resource Strain:   . Difficulty of Paying Living Expenses:   Food Insecurity:   . Worried About Charity fundraiser in the Last Year:   . Arboriculturist in the Last Year:   Transportation Needs:   . Film/video editor (Medical):   Marland Kitchen Lack of Transportation (Non-Medical):   Physical Activity:   . Days of Exercise per Week:   . Minutes of Exercise per Session:   Stress:   . Feeling of Stress :   Social Connections:   . Frequency of Communication with Friends and Family:   . Frequency of Social  Gatherings with Friends and Family:   . Attends Religious Services:   . Active Member of Clubs or Organizations:   . Attends Archivist Meetings:   Marland Kitchen Marital Status:   Intimate Partner Violence:   . Fear of Current or Ex-Partner:   . Emotionally Abused:   Marland Kitchen Physically Abused:   . Sexually Abused:    Family History  Problem Relation Age of Onset  . Colon cancer Mother   . Stomach cancer Father   . Colon cancer Sister     OBJECTIVE:  Vitals:   07/22/19 1603 07/22/19 1605  BP: 128/75   Pulse: 72   Resp: 18   Temp: 98.1 F (36.7 C)   TempSrc: Oral   SpO2: 95%   Weight:  198 lb (89.8 kg)  Height:  5\' 8"  (1.727 m)     General appearance: alert; no distress CV: dorsalis pedis pulse 2+ Skin: apx 2 cm linear wound to LLE with surrounding erythema, tender to touch; no active drainage Psychological: alert and cooperative; normal mood and affect  ASSESSMENT & PLAN:  1. Wound of left lower extremity, initial encounter   2. Wound infection     Meds ordered this encounter  Medications  . doxycycline (VIBRAMYCIN) 100 MG capsule    Sig: Take 1 capsule (100 mg total) by mouth 2 (two) times daily.    Dispense:  20 capsule    Refill:  0    Order Specific Question:   Supervising Provider    Answer:   Raylene Everts [[id number]]   Wash site daily with warm water and mild soap Keep covered to avoid friction Take antibiotic as prescribed and to completion Follow up here or with PCP if symptoms persists Return or go to the ED if you have any new or worsening symptoms increased redness, swelling, pain, nausea, vomiting, fever, chills, etc...    Reviewed expectations re: course of current medical issues. Questions answered. Outlined signs and symptoms indicating need for more acute intervention. Patient verbalized understanding. After Visit Summary given.          Lestine Box, PA-C 07/22/19 1621

## 2019-07-31 ENCOUNTER — Ambulatory Visit: Payer: Self-pay

## 2019-08-07 ENCOUNTER — Ambulatory Visit (INDEPENDENT_AMBULATORY_CARE_PROVIDER_SITE_OTHER): Payer: Medicaid - Out of State | Admitting: Gastroenterology

## 2019-08-12 ENCOUNTER — Ambulatory Visit (INDEPENDENT_AMBULATORY_CARE_PROVIDER_SITE_OTHER): Payer: Medicaid - Out of State | Admitting: Gastroenterology

## 2019-09-30 ENCOUNTER — Ambulatory Visit
Admission: RE | Admit: 2019-09-30 | Discharge: 2019-09-30 | Disposition: A | Discharge status: Home / Self Care | Payer: BC Managed Care – PPO | Source: Ambulatory Visit | Attending: Internal Medicine | Admitting: Internal Medicine

## 2019-09-30 ENCOUNTER — Ambulatory Visit (INDEPENDENT_AMBULATORY_CARE_PROVIDER_SITE_OTHER): Payer: BC Managed Care – PPO

## 2019-09-30 ENCOUNTER — Other Ambulatory Visit: Payer: Self-pay

## 2019-09-30 VITALS — BP 147/77 | HR 67 | Temp 98.3°F | Resp 18

## 2019-09-30 DIAGNOSIS — S99911A Unspecified injury of right ankle, initial encounter: Secondary | ICD-10-CM

## 2019-09-30 DIAGNOSIS — M25571 Pain in right ankle and joints of right foot: Secondary | ICD-10-CM

## 2019-09-30 DIAGNOSIS — R9389 Abnormal findings on diagnostic imaging of other specified body structures: Secondary | ICD-10-CM

## 2019-09-30 MED ORDER — NAPROXEN 500 MG PO TABS: 500.0000 mg | ORAL_TABLET | Freq: Two times a day (BID) | ORAL | 0 refills | Status: AC

## 2019-09-30 NOTE — Discharge Instructions (Signed)
X-rays concerning for age indeterminate avulsion injuries off tip of lateral fibular malleolus and tip of medial malleolus Continue conservative management of rest, ice, and elevation Walking boot given Take naproxen as needed for pain relief (may cause abdominal discomfort, ulcers, and GI bleeds avoid taking with other NSAIDs) Follow up with orthopedist for further evaluation and management Return or go to the ER if you have any new or worsening symptoms (fever, chills, chest pain, redness, swelling, bruising, deformity, etc...)

## 2019-09-30 NOTE — ED Triage Notes (Signed)
Pt presents with complaints of right ankle pain. Reports he twisted his ankle by stepping in a hole. Reports that it was getting progressively worse and becoming hard to ambulate.

## 2019-09-30 NOTE — ED Provider Notes (Signed)
[facility]   [id number] 09/30/19 Arrival Time: 4431  CC: RT ankle PAIN  SUBJECTIVE: History from: patient. Raymond Macias is a 64 y.o. male complains of RT ankle pain x 1 day.  Symptoms began after tripping in a hole.  Localizes the pain to the outside of ankle.  Describes the pain as intermittent and achy in character.  Has tried OTC medications without relief.  Symptoms are made worse with walking.  Denies similar symptoms in the past.  Complains of associated swelling.  Denies fever, chills, erythema, ecchymosis, effusion, weakness, numbness and tingling.   ROS: As per HPI.  All other pertinent ROS negative.     Past Medical History:  Diagnosis Date  . Anxiety   . Diabetes ([place])   . GERD (gastroesophageal reflux disease)   . High cholesterol   . Hypertension    Past Surgical History:  Procedure Laterality Date  . COLONOSCOPY    . COLONOSCOPY N/A 10/08/2015   Procedure: COLONOSCOPY;  Surgeon: Rogene Houston, MD;  [facility]: [facility];  Service: Endoscopy;  Laterality: N/A;  830  . COLONOSCOPY, ESOPHAGOGASTRODUODENOSCOPY (EGD) AND ESOPHAGEAL DILATION     No Known Allergies No current facility-administered medications on file prior to encounter.   Current Outpatient Medications on File Prior to Encounter  Medication Sig Dispense Refill  . atorvastatin (LIPITOR) 40 MG tablet Take 40 mg by mouth daily.     . busPIRone (BUSPAR) 10 MG tablet Take 10 mg by mouth 2 (two) times daily.    . ciprofloxacin (CIPRO) 500 MG tablet Take 1 tablet (500 mg total) by mouth 2 (two) times daily. 20 tablet 0  . ciprofloxacin-dexamethasone (CIPRODEX) OTIC suspension 4 drops 2 (two) times daily.    . citalopram (CELEXA) 40 MG tablet Take 40 mg by mouth daily.    Marland Kitchen dicyclomine (BENTYL) 10 MG capsule Take 1 capsule (10 mg total) by mouth 2 (two) times daily before a meal. 60 capsule 5  . doxycycline (VIBRAMYCIN) 100 MG capsule Take 1 capsule (100 mg total) by mouth 2 (two) times daily. 20  capsule 0  . escitalopram (LEXAPRO) 20 MG tablet Take 20 mg by mouth daily.    Marland Kitchen esomeprazole (NEXIUM) 40 MG capsule TAKE 1 CAPSULE BY MOUTH ONCE DAILY BEFORE  BREAKFAST 30 capsule 4  . loratadine (CLARITIN) 10 MG tablet Take 10 mg by mouth daily.    Marland Kitchen losartan (COZAAR) 25 MG tablet Take 25 mg by mouth daily.    . metFORMIN (GLUCOPHAGE) 500 MG tablet Take by mouth 2 (two) times daily with a meal.    . metroNIDAZOLE (FLAGYL) 250 MG tablet Take 1 tablet (250 mg total) by mouth 3 (three) times daily. 30 tablet 0  . metroNIDAZOLE (FLAGYL) 500 MG tablet Take 1 tablet (500 mg total) by mouth 2 (two) times daily. 20 tablet 0  . promethazine (PHENERGAN) 25 MG tablet Take 1 tablet (25 mg total) by mouth every 6 (six) hours as needed for nausea or vomiting. 10 tablet 0  . Vitamin D, Ergocalciferol, (DRISDOL) 50000 units CAPS capsule TAKE ONE CAPSULE BY MOUTH TWICE A WEEK  4   Social History   Socioeconomic History  . Marital status: Single    Spouse name: Not on file  . Number of children: Not on file  . Years of education: Not on file  . Highest education level: Not on file  Occupational History  . Not on file  Tobacco Use  . Smoking status: Former Smoker  Packs/day: 0.25    Years: 3.00    Pack years: 0.75    Types: Cigarettes  . Smokeless tobacco: Never Used  Substance and Sexual Activity  . Alcohol use: Yes    Alcohol/week: 4.0 standard drinks    Types: 3 Glasses of wine, 1 Shots of liquor per week  . Drug use: No  . Sexual activity: Not on file  Other Topics Concern  . Not on file  Social History Narrative  . Not on file   Social Determinants of Health   Financial Resource Strain:   . Difficulty of Paying Living Expenses: Not on file  Food Insecurity:   . Worried About Charity fundraiser in the Last Year: Not on file  . Ran Out of Food in the Last Year: Not on file  Transportation Needs:   . Lack of Transportation (Medical): Not on file  . Lack of Transportation  (Non-Medical): Not on file  Physical Activity:   . Days of Exercise per Week: Not on file  . Minutes of Exercise per Session: Not on file  Stress:   . Feeling of Stress : Not on file  Social Connections:   . Frequency of Communication with Friends and Family: Not on file  . Frequency of Social Gatherings with Friends and Family: Not on file  . Attends Religious Services: Not on file  . Active Member of Clubs or Organizations: Not on file  . Attends Archivist Meetings: Not on file  . Marital Status: Not on file  Intimate Partner Violence:   . Fear of Current or Ex-Partner: Not on file  . Emotionally Abused: Not on file  . Physically Abused: Not on file  . Sexually Abused: Not on file   Family History  Problem Relation Age of Onset  . Colon cancer Mother   . Stomach cancer Father   . Colon cancer Sister     OBJECTIVE:  Vitals:   09/30/19 1855  BP: (!) 147/77  Pulse: 67  Resp: 18  Temp: 98.3 F (36.8 C)  SpO2: 96%    General appearance: ALERT; in no acute distress.  Head: NCAT Lungs: Normal respiratory effort CV: Dorsalis pedis pulse 2+ Musculoskeletal: RT ankle Inspection: Mild swelling diffuse about the ankle Palpation: TTP over lateral ankle ROM: FROM active and passive Strength: 5/5 dorsiflexion, 5/5 plantar flexion Skin: warm and dry Neurologic: Ambulates without difficulty; Sensation intact about the lower extremities Psychological: alert and cooperative; normal mood and affect  DIAGNOSTIC STUDIES:  DG Ankle Complete Right  Result Date: 09/30/2019 CLINICAL DATA:  Injury EXAM: RIGHT ANKLE - COMPLETE 3+ VIEW COMPARISON:  None. FINDINGS: No dislocation is evident. Age indeterminate punctate avulsion off the tip of the lateral fibular malleolus. Ossicle versus age indeterminate avulsion at the tip of the medial malleolus. The ankle mortise is symmetric. There is mild soft tissue swelling IMPRESSION: Age indeterminate avulsion injuries off the tip of the  lateral fibular malleolus and tip of the medial malleolus. Electronically Signed   By: Donavan Foil M.D.   On: 09/30/2019 19:28    X-rays suspicious for age indeterminate avulsion injuries  I have reviewed the x-rays myself and the radiologist interpretation. I am in agreement with the radiologist interpretation.     ASSESSMENT & PLAN:  1. Acute right ankle pain   2. Injury of right ankle, initial encounter   3. Abnormal x-ray     Meds ordered this encounter  Medications  . naproxen (NAPROSYN) 500 MG tablet  Sig: Take 1 tablet (500 mg total) by mouth 2 (two) times daily.    Dispense:  30 tablet    Refill:  0    Order Specific Question:   Supervising Provider    Answer:   Raylene Everts [[id number]]   X-rays concerning for age indeterminate avulsion injuries off tip of lateral fibular malleolus and tip of medial malleolus Continue conservative management of rest, ice, and elevation Walking boot given Take naproxen as needed for pain relief (may cause abdominal discomfort, ulcers, and GI bleeds avoid taking with other NSAIDs) Follow up with orthopedist for further evaluation and management Return or go to the ER if you have any new or worsening symptoms (fever, chills, chest pain, redness, swelling, bruising, deformity, etc...)   Reviewed expectations re: course of current medical issues. Questions answered. Outlined signs and symptoms indicating need for more acute intervention. Patient verbalized understanding. After Visit Summary given.    Lestine Box, PA-C 09/30/19 1936

## 2019-10-08 ENCOUNTER — Encounter: Payer: Self-pay | Admitting: Orthopaedic Surgery

## 2019-10-08 ENCOUNTER — Other Ambulatory Visit: Payer: Self-pay

## 2019-10-08 ENCOUNTER — Ambulatory Visit: Payer: BC Managed Care – PPO | Admitting: Orthopaedic Surgery

## 2019-10-08 VITALS — BP 148/84 | HR 77 | Ht 68.0 in | Wt 193.0 lb

## 2019-10-08 DIAGNOSIS — S96911A Strain of unspecified muscle and tendon at ankle and foot level, right foot, initial encounter: Secondary | ICD-10-CM

## 2019-10-08 NOTE — Patient Instructions (Signed)
Ok to come out of boot, if you have pain go back in it for a few days Be careful not to twist ankle

## 2019-10-08 NOTE — Progress Notes (Signed)
Subjective:    Patient ID: Raymond Macias, male    DOB: [birth year], 64 y.o.   MRN: [id number]  HPI He stepped in a hole and twisted his right ankle on 09-30-2019.  He was seen in the ER.  He was given CAM walker.  X-rays showed old avulsion fracture of lateral malleolus from old injury.  I have reviewed the ER notes.  I have independently reviewed and interpreted x-rays of this patient done at another site by another physician or qualified health professional.  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  He is much better.  He has no pain today.   Review of Systems  Constitutional: Positive for activity change.  Musculoskeletal: Positive for arthralgias, gait problem and joint swelling.  All other systems reviewed and are negative.  For Review of Systems, all other systems reviewed and are negative.  The following is a summary of the past history medically, past history surgically, known current medicines, social history and family history.  This information is gathered electronically by the computer from prior information and documentation.  I review this each visit and have found including this information at this point in the chart is beneficial and informative.   Past Medical History:  Diagnosis Date   Anxiety    Diabetes ([place])    GERD (gastroesophageal reflux disease)    High cholesterol    Hypertension     Past Surgical History:  Procedure Laterality Date   COLONOSCOPY     COLONOSCOPY N/A 10/08/2015   Procedure: COLONOSCOPY;  Surgeon: Rogene Houston, MD;  [facility];  Service: Endoscopy;  Laterality: N/A;  830   COLONOSCOPY, ESOPHAGOGASTRODUODENOSCOPY (EGD) AND ESOPHAGEAL DILATION      Current Outpatient Medications on File Prior to Visit  Medication Sig Dispense Refill   atorvastatin (LIPITOR) 40 MG tablet Take 40 mg by mouth daily.      busPIRone (BUSPAR) 10 MG tablet Take 10 mg by mouth 2  (two) times daily.     ciprofloxacin (CIPRO) 500 MG tablet Take 1 tablet (500 mg total) by mouth 2 (two) times daily. 20 tablet 0   ciprofloxacin-dexamethasone (CIPRODEX) OTIC suspension 4 drops 2 (two) times daily.     citalopram (CELEXA) 40 MG tablet Take 40 mg by mouth daily.     dicyclomine (BENTYL) 10 MG capsule Take 1 capsule (10 mg total) by mouth 2 (two) times daily before a meal. 60 capsule 5   doxycycline (VIBRAMYCIN) 100 MG capsule Take 1 capsule (100 mg total) by mouth 2 (two) times daily. 20 capsule 0   escitalopram (LEXAPRO) 20 MG tablet Take 20 mg by mouth daily.     esomeprazole (NEXIUM) 40 MG capsule TAKE 1 CAPSULE BY MOUTH ONCE DAILY BEFORE  BREAKFAST 30 capsule 4   loratadine (CLARITIN) 10 MG tablet Take 10 mg by mouth daily.     losartan (COZAAR) 25 MG tablet Take 25 mg by mouth daily.     metFORMIN (GLUCOPHAGE) 500 MG tablet Take by mouth 2 (two) times daily with a meal.     metroNIDAZOLE (FLAGYL) 250 MG tablet Take 1 tablet (250 mg total) by mouth 3 (three) times daily. 30 tablet 0   metroNIDAZOLE (FLAGYL) 500 MG tablet Take 1 tablet (500 mg total) by mouth 2 (two) times daily. 20 tablet 0   naproxen (NAPROSYN) 500 MG tablet Take 1 tablet (500 mg total) by mouth 2 (two) times daily. [place]  tablet 0   promethazine (PHENERGAN) 25 MG tablet Take 1 tablet (25 mg total) by mouth every 6 (six) hours as needed for nausea or vomiting. 10 tablet 0   Vitamin D, Ergocalciferol, (DRISDOL) 50000 units CAPS capsule TAKE ONE CAPSULE BY MOUTH TWICE A WEEK  4   No current facility-administered medications on file prior to visit.    Social History   Socioeconomic History   Marital status: Single    Spouse name: Not on file   Number of children: Not on file   Years of education: Not on file   Highest education level: Not on file  Occupational History   Not on file  Tobacco Use   Smoking status: Former Smoker    Packs/day: 0.25    Years: 3.00    Pack years: 0.75      Types: Cigarettes   Smokeless tobacco: Never Used  Substance and Sexual Activity   Alcohol use: Yes    Alcohol/week: 4.0 standard drinks    Types: 3 Glasses of wine, 1 Shots of liquor per week   Drug use: No   Sexual activity: Not on file  Other Topics Concern   Not on file  Social History Narrative   Not on file   Social Determinants of Health   Financial Resource Strain:    Difficulty of Paying Living Expenses: Not on file  Food Insecurity:    Worried About [place] in the Last Year: Not on file   Ran Out of Food in the Last Year: Not on file  Transportation Needs:    Lack of Transportation (Medical): Not on file   Lack of Transportation (Non-Medical): Not on file  Physical Activity:    Days of Exercise per Week: Not on file   Minutes of Exercise per Session: Not on file  Stress:    Feeling of Stress : Not on file  Social Connections:    Frequency of Communication with Friends and Family: Not on file   Frequency of Social Gatherings with Friends and Family: Not on file   Attends Religious Services: Not on file   Active Member of Clubs or Organizations: Not on file   Attends Archivist Meetings: Not on file   Marital Status: Not on file  Intimate Partner Violence:    Fear of Current or Ex-Partner: Not on file   Emotionally Abused: Not on file   Physically Abused: Not on file   Sexually Abused: Not on file    Family History  Problem Relation Age of Onset   Colon cancer Mother    Stomach cancer Father    Colon cancer Sister     BP (!) 148/84    Pulse 77    Ht 5\' 8"  (1.727 m)    Wt 193 lb (87.5 kg)    BMI 29.35 kg/m   Body mass index is 29.35 kg/m.      Objective:   Physical Exam Vitals and nursing note reviewed.  Constitutional:      Appearance: He is well-developed.  HENT:     Head: Normocephalic and atraumatic.  Eyes:     Conjunctiva/sclera: Conjunctivae normal.     Pupils: Pupils are equal, round,  and reactive to light.  Cardiovascular:     Rate and Rhythm: Normal rate and regular rhythm.  Pulmonary:     Effort: Pulmonary effort is normal.  Abdominal:     Palpations: Abdomen is soft.  Musculoskeletal:     Cervical back: Normal  range of motion and neck supple.       Feet:  Skin:    General: Skin is warm and dry.  Neurological:     Mental Status: He is alert and oriented to person, place, and time.     Cranial Nerves: No cranial nerve deficit.     Motor: No abnormal muscle tone.     Coordination: Coordination normal.     Deep Tendon Reflexes: Reflexes are normal and symmetric. Reflexes normal.  Psychiatric:        Behavior: Behavior normal.        Thought Content: Thought content normal.        Judgment: Judgment normal.           Assessment & Plan:   Encounter Diagnosis  Name Primary?   Ankle strain, right, initial encounter Yes   He is doing well.  He can stop the CAM walker.  Call if any problem.  Precautions discussed.   I will see as needed.  He has an ankle brace he can use.  Electronically Signed Sanjuana Kava, MD 9/7/20219:22 AM

## 2019-10-14 ENCOUNTER — Ambulatory Visit (INDEPENDENT_AMBULATORY_CARE_PROVIDER_SITE_OTHER): Payer: Medicaid - Out of State | Admitting: Gastroenterology

## 2020-01-28 ENCOUNTER — Telehealth (INDEPENDENT_AMBULATORY_CARE_PROVIDER_SITE_OTHER): Payer: Self-pay | Admitting: Internal Medicine

## 2020-01-28 NOTE — Telephone Encounter (Signed)
Patient will come by the office on Thursday to pick up records -

## 2020-01-29 NOTE — Telephone Encounter (Signed)
Records have been faxed.

## 2020-03-23 ENCOUNTER — Other Ambulatory Visit: Payer: Self-pay

## 2020-03-23 ENCOUNTER — Ambulatory Visit
Admission: RE | Admit: 2020-03-23 | Discharge: 2020-03-23 | Disposition: A | Discharge status: Home / Self Care | Payer: BC Managed Care – PPO | Source: Ambulatory Visit | Attending: Emergency Medicine | Admitting: Emergency Medicine

## 2020-03-23 VITALS — BP 127/77 | HR 74 | Temp 97.5°F | Resp 18

## 2020-03-23 DIAGNOSIS — R490 Dysphonia: Secondary | ICD-10-CM | POA: Diagnosis not present

## 2020-03-23 MED ORDER — CETIRIZINE HCL 10 MG PO TABS: 10.0000 mg | ORAL_TABLET | Freq: Every day | ORAL | 0 refills | Status: AC

## 2020-03-23 MED ORDER — PREDNISONE 10 MG PO TABS: 20.0000 mg | ORAL_TABLET | Freq: Every day | ORAL | 0 refills | Status: AC

## 2020-03-23 NOTE — ED Provider Notes (Signed)
[facility]   [id number] 03/23/20 Arrival Time: 1648   Chief Complaint  Patient presents with  . Hoarse     SUBJECTIVE: History from: patient.  Raymond Macias is a 65 y.o. male who presented to the urgent care for complaint of hoarseness for the past few days.  Denies any precipitating event or exposure to URI symptom.  Denies recent travel.  Has tried OTC medication with no relief.  Denies alleviating or aggravating factors.  Denies previous symptoms in the past.   Denies fever, chills, fatigue, sinus pain, rhinorrhea, sore throat, SOB, wheezing, chest pain, nausea, changes in bowel or bladder habits.    ROS: As per HPI.  All other pertinent ROS negative.      Past Medical History:  Diagnosis Date  . Anxiety   . Diabetes ([place])   . GERD (gastroesophageal reflux disease)   . High cholesterol   . Hypertension    Past Surgical History:  Procedure Laterality Date  . COLONOSCOPY    . COLONOSCOPY N/A 10/08/2015   Procedure: COLONOSCOPY;  Surgeon: Rogene Houston, MD;  [facility];  Service: Endoscopy;  Laterality: N/A;  830  . COLONOSCOPY, ESOPHAGOGASTRODUODENOSCOPY (EGD) AND ESOPHAGEAL DILATION     No Known Allergies No current facility-administered medications on file prior to encounter.   Current Outpatient Medications on File Prior to Encounter  Medication Sig Dispense Refill  . atorvastatin (LIPITOR) 40 MG tablet Take 40 mg by mouth daily.     . busPIRone (BUSPAR) 10 MG tablet Take 10 mg by mouth 2 (two) times daily.    . ciprofloxacin (CIPRO) 500 MG tablet Take 1 tablet (500 mg total) by mouth 2 (two) times daily. 20 tablet 0  . ciprofloxacin-dexamethasone (CIPRODEX) OTIC suspension 4 drops 2 (two) times daily.    . citalopram (CELEXA) 40 MG tablet Take 40 mg by mouth daily.    Marland Kitchen dicyclomine (BENTYL) 10 MG capsule Take 1 capsule (10 mg total) by mouth 2 (two) times daily before a meal. 60 capsule 5  . doxycycline (VIBRAMYCIN) 100 MG capsule Take 1  capsule (100 mg total) by mouth 2 (two) times daily. 20 capsule 0  . escitalopram (LEXAPRO) 20 MG tablet Take 20 mg by mouth daily.    Marland Kitchen esomeprazole (NEXIUM) 40 MG capsule TAKE 1 CAPSULE BY MOUTH ONCE DAILY BEFORE  BREAKFAST 30 capsule 4  . loratadine (CLARITIN) 10 MG tablet Take 10 mg by mouth daily.    Marland Kitchen losartan (COZAAR) 25 MG tablet Take 25 mg by mouth daily.    . metFORMIN (GLUCOPHAGE) 500 MG tablet Take by mouth 2 (two) times daily with a meal.    . metroNIDAZOLE (FLAGYL) 250 MG tablet Take 1 tablet (250 mg total) by mouth 3 (three) times daily. 30 tablet 0  . metroNIDAZOLE (FLAGYL) 500 MG tablet Take 1 tablet (500 mg total) by mouth 2 (two) times daily. 20 tablet 0  . naproxen (NAPROSYN) 500 MG tablet Take 1 tablet (500 mg total) by mouth 2 (two) times daily. 30 tablet 0  . promethazine (PHENERGAN) 25 MG tablet Take 1 tablet (25 mg total) by mouth every 6 (six) hours as needed for nausea or vomiting. 10 tablet 0  . Vitamin D, Ergocalciferol, (DRISDOL) 50000 units CAPS capsule TAKE ONE CAPSULE BY MOUTH TWICE A WEEK  4   Social History   Socioeconomic History  . Marital status: Single    Spouse name: Not on file  . Number of children: Not on file  .  Years of education: Not on file  . Highest education level: Not on file  Occupational History  . Not on file  Tobacco Use  . Smoking status: Former Smoker    Packs/day: 0.25    Years: 3.00    Pack years: 0.75    Types: Cigarettes  . Smokeless tobacco: Never Used  Substance and Sexual Activity  . Alcohol use: Yes    Alcohol/week: 4.0 standard drinks    Types: 3 Glasses of wine, 1 Shots of liquor per week  . Drug use: No  . Sexual activity: Not on file  Other Topics Concern  . Not on file  Social History Narrative  . Not on file   Social Determinants of Health   Financial Resource Strain: Not on file  Food Insecurity: Not on file  Transportation Needs: Not on file  Physical Activity: Not on file  Stress: Not on file   Social Connections: Not on file  Intimate Partner Violence: Not on file   Family History  Problem Relation Age of Onset  . Colon cancer Mother   . Stomach cancer Father   . Colon cancer Sister     OBJECTIVE:  Vitals:   03/23/20 1739  BP: 127/77  Pulse: 74  Resp: 18  Temp: (!) 97.5 F (36.4 C)  TempSrc: Oral  SpO2: 95%     Physical Exam Vitals and nursing note reviewed.  Constitutional:      General: He is not in acute distress.    Appearance: Normal appearance. He is normal weight. He is not ill-appearing, toxic-appearing or diaphoretic.  HENT:     Head: Normocephalic.     Right Ear: Tympanic membrane, ear canal and external ear normal. There is no impacted cerumen.     Left Ear: Tympanic membrane, ear canal and external ear normal. There is no impacted cerumen.     Mouth/Throat:     Lips: Pink.     Mouth: Mucous membranes are moist.     Dentition: Normal dentition.     Pharynx: Oropharynx is clear. No pharyngeal swelling, oropharyngeal exudate, posterior oropharyngeal erythema or uvula swelling.  Cardiovascular:     Rate and Rhythm: Normal rate and regular rhythm.     Pulses: Normal pulses.     Heart sounds: Normal heart sounds. No murmur heard. No friction rub. No gallop.   Pulmonary:     Effort: Pulmonary effort is normal. No respiratory distress.     Breath sounds: Normal breath sounds. No stridor. No wheezing, rhonchi or rales.  Chest:     Chest wall: No tenderness.  Neurological:     Mental Status: He is alert and oriented to person, place, and time.     LABS:  No results found for this or any previous visit (from the past 24 hour(s)).   ASSESSMENT & PLAN:  1. Hoarseness of voice     Meds ordered this encounter  Medications  . cetirizine (ZYRTEC ALLERGY) 10 MG tablet    Sig: Take 1 tablet (10 mg total) by mouth daily.    Dispense:  30 tablet    Refill:  0  . predniSONE (DELTASONE) 10 MG tablet    Sig: Take 2 tablets (20 mg total) by mouth  daily.    Dispense:  15 tablet    Refill:  0    Discharge instructions  Get plenty of rest and push fluids Zyrtec was prescribed  Prednisone was prescribed Use OTC medications like ibuprofen or tylenol as needed fever or  pain Call or go to the ED if you have any new or worsening symptoms such as fever, worsening cough, shortness of breath, chest tightness, chest pain, turning blue, changes in mental status, etc...   Reviewed expectations re: course of current medical issues. Questions answered. Outlined signs and symptoms indicating need for more acute intervention. Patient verbalized understanding. After Visit Summary given.         Emerson Monte, [place] 03/23/20 1821

## 2020-03-23 NOTE — Discharge Instructions (Addendum)
Get plenty of rest and push fluids Zyrtec was prescribed  Prednisone was prescribed Use OTC medications like ibuprofen or tylenol as needed fever or pain Call or go to the ED if you have any new or worsening symptoms such as fever, worsening cough, shortness of breath, chest tightness, chest pain, turning blue, changes in mental status, etc..Marland Kitchen

## 2020-03-23 NOTE — ED Triage Notes (Signed)
Hoarseness for the past several days.

## 2020-09-29 ENCOUNTER — Encounter (INDEPENDENT_AMBULATORY_CARE_PROVIDER_SITE_OTHER): Payer: Self-pay | Admitting: *Deleted

## 2022-03-31 ENCOUNTER — Encounter: Payer: Self-pay | Admitting: Radiology

## 2022-08-28 IMAGING — DX DG ANKLE COMPLETE 3+V*R*
3 series · 3 of 3 positions shown · non-contrast
Comparison: None.

CLINICAL DATA: Injury

EXAM:
RIGHT ANKLE - COMPLETE 3+ VIEW

[ankle ap]
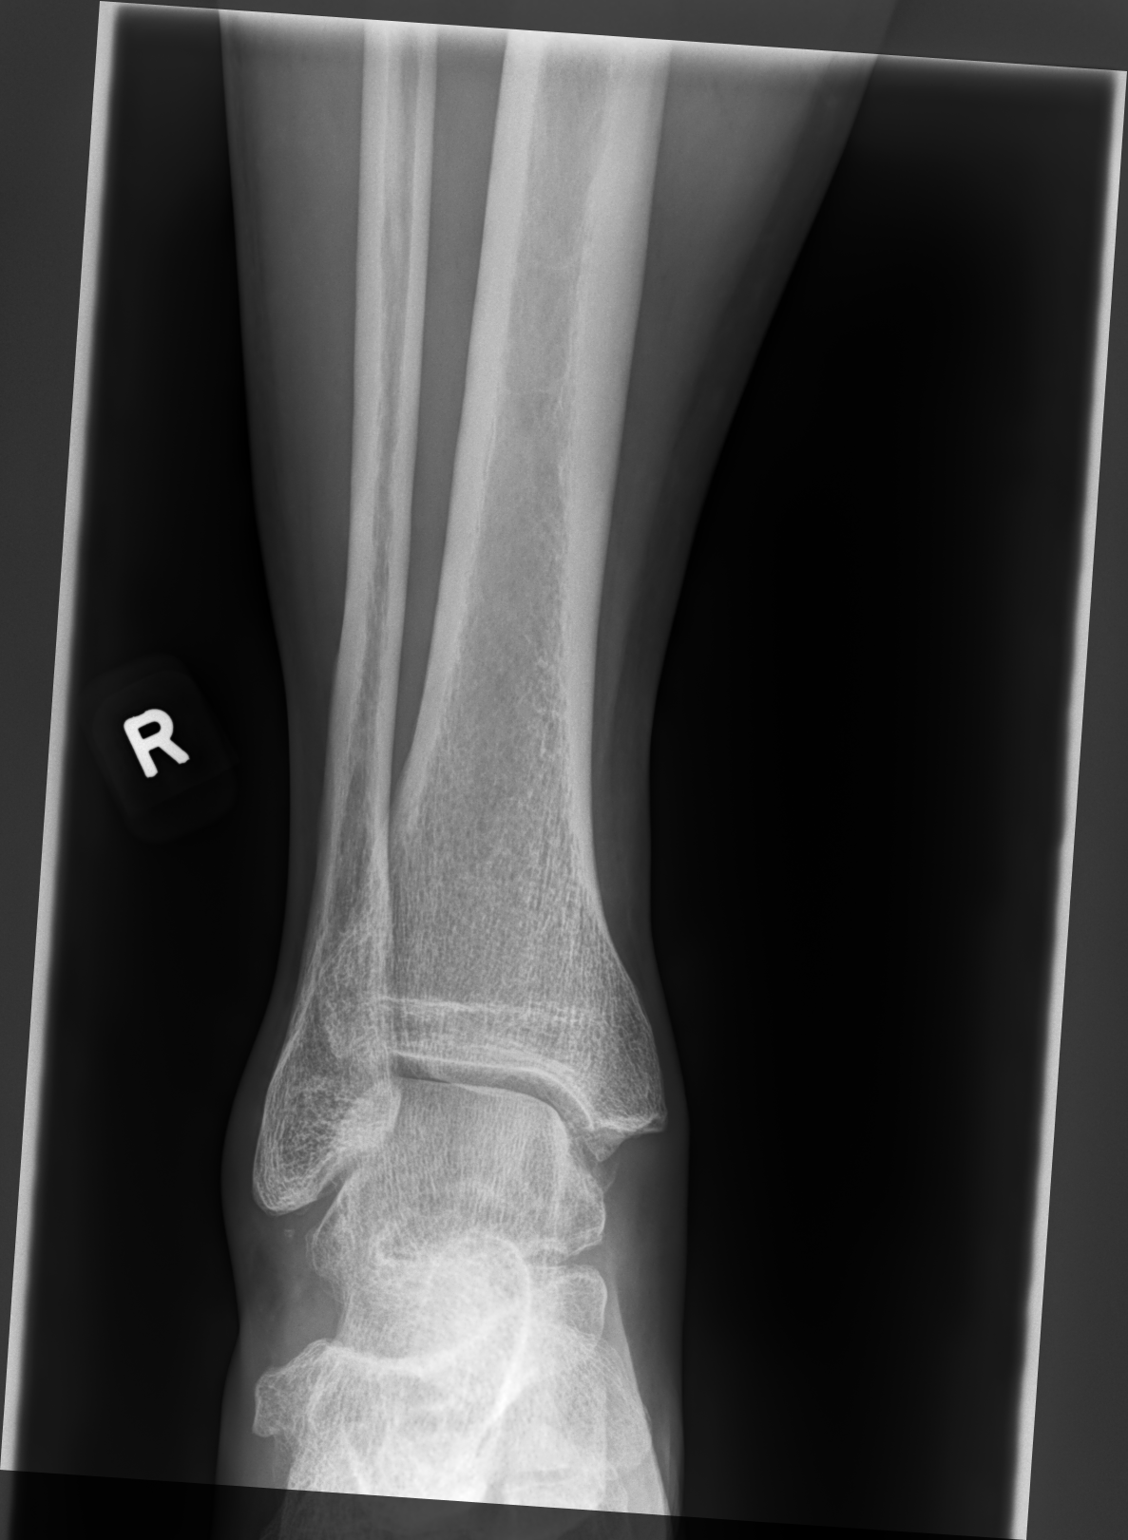

[ankle mlo]
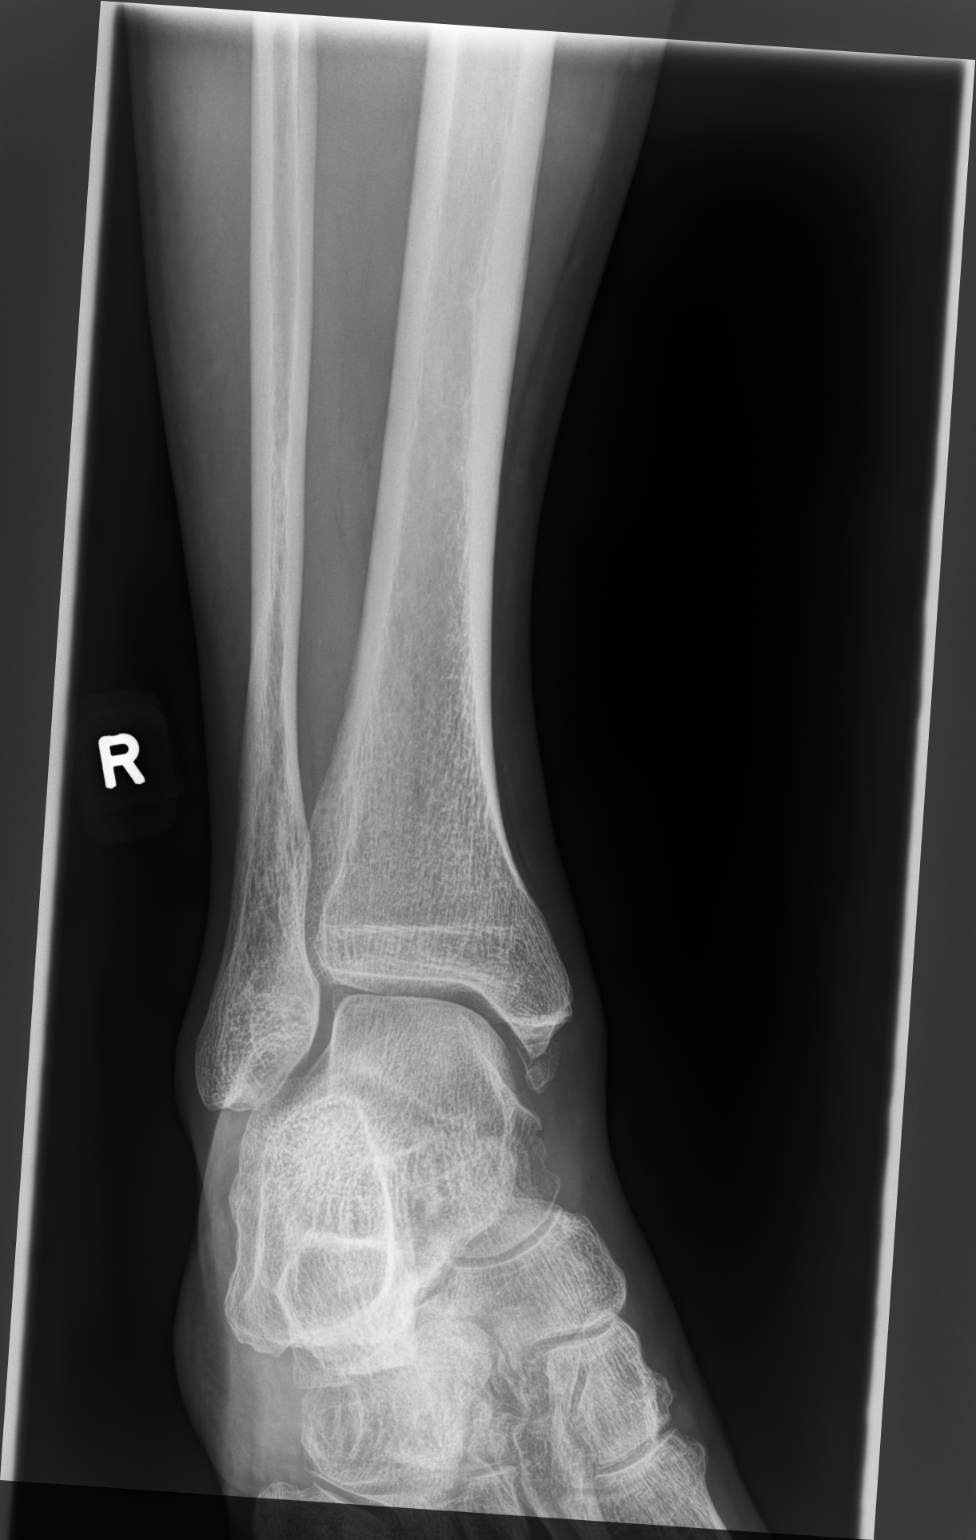

[ankle lat]
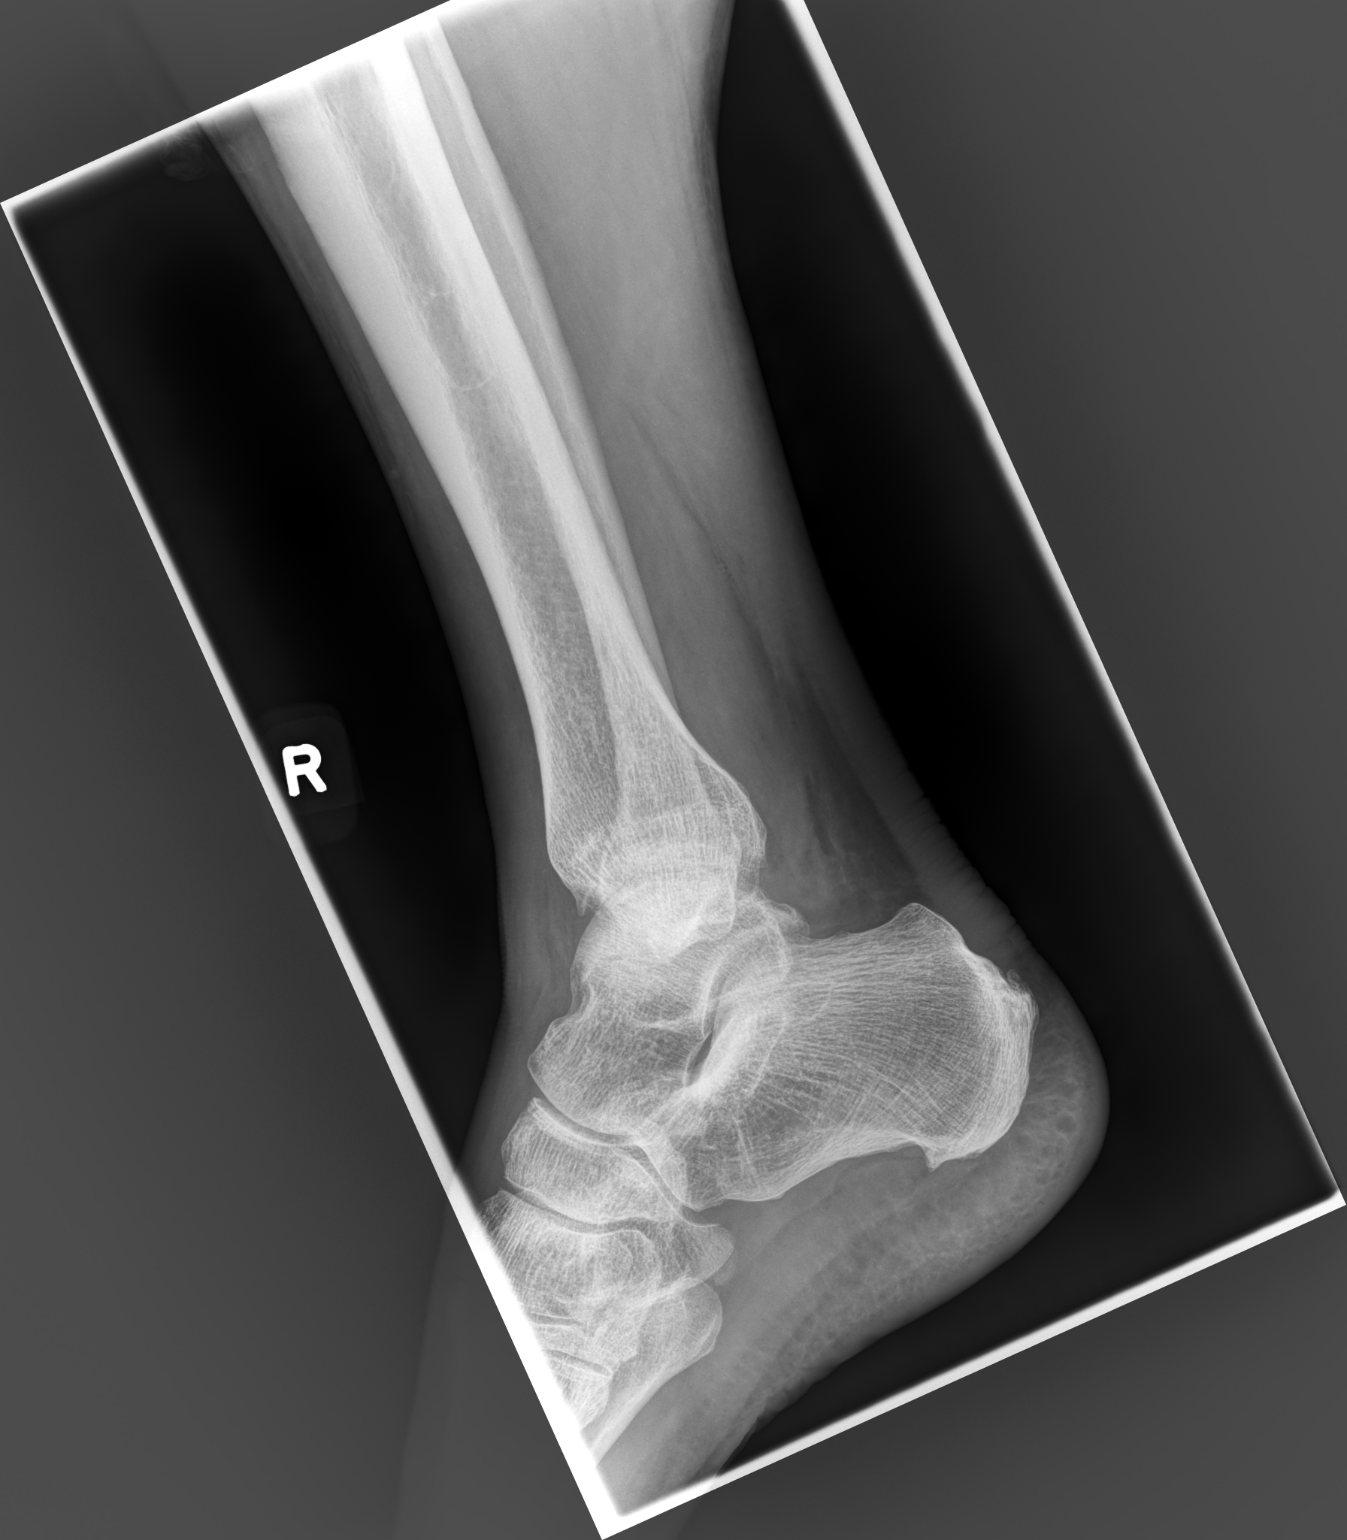

[3 of 3 positions shown; findings below may reference images not displayed]

FINDINGS: No dislocation is evident. Age indeterminate punctate avulsion off
the tip of the lateral fibular malleolus. Ossicle versus age
indeterminate avulsion at the tip of the medial malleolus. The ankle
mortise is symmetric. There is mild soft tissue swelling
IMPRESSION: Age indeterminate avulsion injuries off the tip of the lateral
fibular malleolus and tip of the medial malleolus.
